# Patient Record
Sex: Female | Born: 1985 | Race: White | Hispanic: No | Marital: Single | Smoking: Former smoker
Health system: Southern US, Community
[De-identification: ages and names within clinical notes are randomized; demographics above are authoritative.]

## PROBLEM LIST (undated history)

## (undated) DIAGNOSIS — J40 Bronchitis, not specified as acute or chronic: Secondary | ICD-10-CM

## (undated) DIAGNOSIS — Z8669 Personal history of other diseases of the nervous system and sense organs: Secondary | ICD-10-CM

## (undated) DIAGNOSIS — H6990 Unspecified Eustachian tube disorder, unspecified ear: Secondary | ICD-10-CM

## (undated) DIAGNOSIS — H6980 Other specified disorders of Eustachian tube, unspecified ear: Secondary | ICD-10-CM

## (undated) HISTORY — PX: OTHER SURGICAL HISTORY: SHX169

---
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
## 2006-05-30 ENCOUNTER — Emergency Department: Payer: Self-pay | Admitting: Emergency Medicine

## 2007-01-12 ENCOUNTER — Emergency Department: Payer: Self-pay | Admitting: Internal Medicine

## 2007-03-22 ENCOUNTER — Emergency Department: Payer: Self-pay | Admitting: Emergency Medicine

## 2008-10-24 ENCOUNTER — Emergency Department: Payer: Self-pay | Admitting: Emergency Medicine

## 2009-02-21 ENCOUNTER — Emergency Department: Payer: Self-pay | Admitting: Emergency Medicine

## 2011-04-09 ENCOUNTER — Emergency Department: Payer: Self-pay | Admitting: Unknown Physician Specialty

## 2011-06-04 ENCOUNTER — Emergency Department: Payer: Self-pay | Admitting: Emergency Medicine

## 2011-08-04 ENCOUNTER — Observation Stay: Payer: Self-pay

## 2011-08-04 LAB — URINALYSIS, COMPLETE
Bilirubin,UR: NEGATIVE
Blood: NEGATIVE
Glucose,UR: NEGATIVE mg/dL (ref 0–75)
Ketone: NEGATIVE
Leukocyte Esterase: NEGATIVE
Nitrite: NEGATIVE
Ph: 9 (ref 4.5–8.0)
RBC,UR: <1 /HPF (ref 0–5)
Specific Gravity: 1.019 (ref 1.003–1.030)

## 2011-09-18 ENCOUNTER — Inpatient Hospital Stay: Payer: Self-pay

## 2011-09-18 LAB — CBC WITH DIFFERENTIAL/PLATELET
Basophil #: 0 10*3/uL (ref 0.0–0.1)
Basophil %: 0.2 %
Eosinophil #: 0.1 10*3/uL (ref 0.0–0.7)
MCH: 30.4 pg (ref 26.0–34.0)
Monocyte #: 0.5 x10 3/mm (ref 0.2–0.9)
RBC: 4.05 10*6/uL (ref 3.80–5.20)
RDW: 12.7 % (ref 11.5–14.5)
WBC: 12.4 10*3/uL — ABNORMAL HIGH (ref 3.6–11.0)

## 2012-01-12 ENCOUNTER — Emergency Department: Payer: Self-pay | Admitting: Emergency Medicine

## 2012-01-14 LAB — BETA STREP CULTURE(ARMC)

## 2012-07-26 ENCOUNTER — Emergency Department: Payer: Self-pay | Admitting: Emergency Medicine

## 2012-09-23 ENCOUNTER — Emergency Department: Payer: Self-pay | Admitting: Emergency Medicine

## 2013-07-05 ENCOUNTER — Emergency Department: Payer: Self-pay | Admitting: Emergency Medicine

## 2013-10-12 ENCOUNTER — Emergency Department: Payer: Self-pay | Admitting: Emergency Medicine

## 2013-10-30 ENCOUNTER — Emergency Department: Payer: Self-pay | Admitting: Emergency Medicine

## 2013-10-30 LAB — CBC
HCT: 38.5 % (ref 35.0–47.0)
HGB: 13.2 g/dL (ref 12.0–16.0)
MCH: 31.3 pg (ref 26.0–34.0)
MCHC: 34.3 g/dL (ref 32.0–36.0)
MCV: 91 fL (ref 80–100)
Platelet: 291 10*3/uL (ref 150–440)
RBC: 4.22 10*6/uL (ref 3.80–5.20)
RDW: 12.7 % (ref 11.5–14.5)
WBC: 7 10*3/uL (ref 3.6–11.0)

## 2013-10-30 LAB — COMPREHENSIVE METABOLIC PANEL
Albumin: 3.6 g/dL (ref 3.4–5.0)
Alkaline Phosphatase: 125 U/L — ABNORMAL HIGH
Anion Gap: 8 (ref 7–16)
BILIRUBIN TOTAL: 0.3 mg/dL (ref 0.2–1.0)
BUN: 8 mg/dL (ref 7–18)
CALCIUM: 8.2 mg/dL — AB (ref 8.5–10.1)
CO2: 23 mmol/L (ref 21–32)
Chloride: 109 mmol/L — ABNORMAL HIGH (ref 98–107)
Creatinine: 0.65 mg/dL (ref 0.60–1.30)
EGFR (Non-African Amer.): >60
Glucose: 81 mg/dL (ref 65–99)
Osmolality: 277 (ref 275–301)
Potassium: 3.2 mmol/L — ABNORMAL LOW (ref 3.5–5.1)
SGOT(AST): 23 U/L (ref 15–37)
SGPT (ALT): 35 U/L
Sodium: 140 mmol/L (ref 136–145)
Total Protein: 6.6 g/dL (ref 6.4–8.2)

## 2013-11-16 ENCOUNTER — Emergency Department: Payer: Self-pay | Admitting: Emergency Medicine

## 2013-12-17 ENCOUNTER — Emergency Department: Payer: Self-pay | Admitting: Emergency Medicine

## 2014-01-04 ENCOUNTER — Emergency Department: Payer: Self-pay | Admitting: Emergency Medicine

## 2014-01-12 ENCOUNTER — Emergency Department: Payer: Self-pay | Admitting: Emergency Medicine

## 2014-01-15 ENCOUNTER — Emergency Department: Payer: Self-pay | Admitting: Student

## 2014-01-15 LAB — BASIC METABOLIC PANEL
Anion Gap: 7 (ref 7–16)
BUN: 5 mg/dL — ABNORMAL LOW (ref 7–18)
CALCIUM: 9.1 mg/dL (ref 8.5–10.1)
Chloride: 108 mmol/L — ABNORMAL HIGH (ref 98–107)
Co2: 26 mmol/L (ref 21–32)
Creatinine: 0.72 mg/dL (ref 0.60–1.30)
EGFR (African American): >60
GLUCOSE: 84 mg/dL (ref 65–99)
Osmolality: 278 (ref 275–301)
Potassium: 3.6 mmol/L (ref 3.5–5.1)
Sodium: 141 mmol/L (ref 136–145)

## 2014-01-15 LAB — CBC
HCT: 42.3 % (ref 35.0–47.0)
HGB: 14.3 g/dL (ref 12.0–16.0)
MCH: 30.7 pg (ref 26.0–34.0)
MCHC: 33.8 g/dL (ref 32.0–36.0)
MCV: 91 fL (ref 80–100)
PLATELETS: 336 10*3/uL (ref 150–440)
RBC: 4.66 10*6/uL (ref 3.80–5.20)
RDW: 12.5 % (ref 11.5–14.5)
WBC: 8.7 10*3/uL (ref 3.6–11.0)

## 2014-02-01 ENCOUNTER — Emergency Department: Payer: Self-pay | Admitting: Student

## 2014-05-17 ENCOUNTER — Emergency Department: Payer: Self-pay | Admitting: Emergency Medicine

## 2014-05-17 LAB — CBC
HCT: 43.4 % (ref 35.0–47.0)
HGB: 14.3 g/dL (ref 12.0–16.0)
MCH: 30.3 pg (ref 26.0–34.0)
MCHC: 33.1 g/dL (ref 32.0–36.0)
MCV: 92 fL (ref 80–100)
Platelet: 282 10*3/uL (ref 150–440)
RBC: 4.73 10*6/uL (ref 3.80–5.20)
RDW: 12.6 % (ref 11.5–14.5)
WBC: 5.9 10*3/uL (ref 3.6–11.0)

## 2014-05-17 LAB — COMPREHENSIVE METABOLIC PANEL
ALBUMIN: 4.1 g/dL (ref 3.4–5.0)
AST: 30 U/L (ref 15–37)
Alkaline Phosphatase: 113 U/L (ref 46–116)
Anion Gap: 6 — ABNORMAL LOW (ref 7–16)
BUN: 17 mg/dL (ref 7–18)
Bilirubin,Total: 0.2 mg/dL (ref 0.2–1.0)
CO2: 29 mmol/L (ref 21–32)
Calcium, Total: 9 mg/dL (ref 8.5–10.1)
Chloride: 105 mmol/L (ref 98–107)
Creatinine: 0.73 mg/dL (ref 0.60–1.30)
EGFR (African American): >60
EGFR (Non-African Amer.): >60
GLUCOSE: 79 mg/dL (ref 65–99)
OSMOLALITY: 280 (ref 275–301)
Potassium: 3.6 mmol/L (ref 3.5–5.1)
SGPT (ALT): 34 U/L (ref 14–63)
SODIUM: 140 mmol/L (ref 136–145)
Total Protein: 7.6 g/dL (ref 6.4–8.2)

## 2014-05-17 LAB — URINALYSIS, COMPLETE
BILIRUBIN, UR: NEGATIVE
GLUCOSE, UR: NEGATIVE mg/dL (ref 0–75)
NITRITE: NEGATIVE
Ph: 5 (ref 4.5–8.0)
Protein: NEGATIVE
Specific Gravity: 1.02 (ref 1.003–1.030)
Squamous Epithelial: 10
WBC UR: 188 /HPF (ref 0–5)

## 2014-08-02 ENCOUNTER — Emergency Department
Admit: 2014-08-02 | Disposition: A | Discharge status: Home / Self Care | Payer: Self-pay | Admitting: Emergency Medicine

## 2014-08-17 NOTE — H&P (Signed)
L&D Evaluation:  History:   HPI 29 year old G2P1001 presents to L&D at 31 weeks after 3 episodes of vomiting this AM followed by intense abdominal pain/cramping. ED[facility]05/11[facility], WNL anatomy scan. Unremarkable PNC so far. Ob hx of 1 SVD in 2007 in which she claims to have had "preterm labor" which was stopped at 32 weeks and subsequently delivered at term.  Labs: O Pos, VI, RI    Presents with abdominal pain, nausea/vomiting    Patient's Medical History No Chronic Illness    Patient's Surgical History none    Medications Pre Natal Vitamins    Allergies keflex    Social History none    Family History Non-Contributory   ROS:   ROS see HPI   Exam:   Vital Signs stable    Urine Protein trace    General no apparent distress, breathing through some ctx's    Mental Status clear    Chest clear    Abdomen gravid, non-tender    Estimated Fetal Weight Average for gestational age    Back no CVAT    Edema no edema    Pelvic no external lesions, closed/ft dilated per RN    Mebranes Intact    FHT normal rate with no decels    Ucx much irritability noted, occas ctx    Skin dry   Impression:   Impression dehydration, possible gastroenteritis, uterine irritabillity   Plan:   Plan UA, EFM/NST, monitor contractions and for cervical change, fluids, IV hydration, terbultiline x1, zofran IV, monitor    Comments FFN has been sent, UA negative   Electronic Signatures: Shella MaximPutnam, Edison Wollschlager (CNM)  (Signed 27-Apr-13 11:12)  Authored: L&D Evaluation   Last Updated: 27-Apr-13 11:12 by Shella MaximPutnam, Lakisa Lotz (CNM)

## 2014-08-17 NOTE — H&P (Signed)
L&D Evaluation:  History Expanded:   HPI 29 year old G2P1001, EDD of 10/05/11 per early US, presents to L&D at 37 4/7 weeks with c/o contractions and severe back muscle spasms. Given Flexeril recently with not relief. No LOF or VB.  [facility] and frequent URI's.  Ob hx of 1 SVD in 2007 in which she claims to have had "preterm labor" which was stopped at 32 weeks and subsequently delivered at term.    Blood Type O positive    Group B Strep Results (Result >5wks must be treated as unknown) negative    Maternal HIV Negative    Maternal Syphilis Ab Nonreactive    Maternal Varicella Immune    Rubella Results immune    Maternal T-Dap Unknown    Presents with abdominal pain, nausea/vomiting    Patient's Medical History No Chronic Illness    Patient's Surgical History knee surgery    Medications Pre Natal Vitamins    Allergies keflex    Social History tobacco    Family History Non-Contributory   ROS:   ROS see HPI   Exam:   Vital Signs stable    Urine Protein trace    General no apparent distress    Mental Status clear    Abdomen gravid, non-tender    Estimated Fetal Weight Average for gestational age    Back no CVAT    Edema no edema    Pelvic no external lesions, 4/75/-1    Mebranes Intact    FHT normal rate with no decels    Ucx regular    Skin dry   Impression:   Impression early labor   Plan:   Plan UA, EFM/NST, monitor contractions and for cervical change, antibiotics for GBBS prophylaxis    Comments IV Stadol   Electronic Signatures: Vella KohlerBrothers, Maday Guarino K (CNM)  (Signed 11-Jun-13 07:04)  Authored: L&D Evaluation   Last Updated: 11-Jun-13 07:04 by Vella KohlerBrothers, Gavon Majano K (CNM)

## 2014-12-11 ENCOUNTER — Emergency Department
Admission: EM | Admit: 2014-12-11 | Discharge: 2014-12-11 | Disposition: A | Discharge status: Home / Self Care | Payer: Self-pay | Attending: Emergency Medicine | Admitting: Emergency Medicine

## 2014-12-11 ENCOUNTER — Encounter: Payer: Self-pay | Admitting: Emergency Medicine

## 2014-12-11 DIAGNOSIS — H6691 Otitis media, unspecified, right ear: Secondary | ICD-10-CM | POA: Insufficient documentation

## 2014-12-11 DIAGNOSIS — Z720 Tobacco use: Secondary | ICD-10-CM | POA: Insufficient documentation

## 2014-12-11 HISTORY — DX: Unspecified eustachian tube disorder, unspecified ear: H69.90

## 2014-12-11 HISTORY — DX: Bronchitis, not specified as acute or chronic: J40

## 2014-12-11 HISTORY — DX: Other specified disorders of Eustachian tube, unspecified ear: H69.80

## 2014-12-11 MED ORDER — AMOXICILLIN-POT CLAVULANATE 875-125 MG PO TABS: 1.0000 | ORAL_TABLET | Freq: Two times a day (BID) | ORAL | Status: DC

## 2014-12-11 NOTE — ED Notes (Signed)
Pt reports sinus congestion and chest congestion; bilateral ear pain.

## 2014-12-11 NOTE — ED Notes (Signed)
Pt states nasal congestion and bilateral ear congestion for several weeks, worsening past several days.  Pt seen by PCP on prednisone, zyrtec, and IBU.  Pt states MD dx with eustachian tube dysfunction.

## 2014-12-11 NOTE — ED Provider Notes (Signed)
[facility] Emergency Department Provider Note ____________________________________________  Time seen: Approximately 8:20 PM  I have reviewed the triage vital signs and the nursing notes.   HISTORY  Chief Complaint Nasal Congestion   HPI Peggy King is a 29 y.o. female patient is here with nasal congestion and bilateral ear condition for several weeks. She states this been worsening for the last 2 days. Patient states she was seen by her PCP and placed on prednisone, Zyrtec and ibuprofen. She states that this is not helping. She is unaware of any fever or chills. She continues to smoke and is trying to stop. Currently her pain is 5 out of 10.   Past Medical History  Diagnosis Date  . Bronchitis   . Eustachian tube dysfunction     There are no active problems to display for this patient.   History reviewed. No pertinent past surgical history.  Current Outpatient Rx  Name  Route  Sig  Dispense  Refill  . amoxicillin-clavulanate (AUGMENTIN) 875-125 MG per tablet   Oral   Take 1 tablet by mouth 2 (two) times daily.   20 tablet   0     Allergies Prednisone  No family history on file.  Social History Social History  Substance Use Topics  . Smoking status: Current Every Day Smoker  . Smokeless tobacco: None  . Alcohol Use: No    Review of Systems Constitutional: No fever/chills Eyes: No visual changes. ENT: No sore throat. Bilateral ear pain Cardiovascular: Denies chest pain. Respiratory: Denies shortness of breath. Gastrointestinal: No abdominal pain.  No nausea, no vomiting.   Genitourinary: Negative for dysuria. Musculoskeletal: Negative for back pain. Skin: Negative for rash. Neurological: Negative for headaches, focal weakness or numbness.  10-point ROS otherwise negative.  ____________________________________________   PHYSICAL EXAM:  VITAL SIGNS: ED Triage Vitals  Enc Vitals Group     BP 12/11/14 1929 107/65 mmHg    Pulse Rate 12/11/14 1929 99     Resp 12/11/14 1929 16     Temp 12/11/14 1929 98.1 F (36.7 C)     Temp Source 12/11/14 1929 Oral     SpO2 12/11/14 1929 97 %     Weight 12/11/14 1929 115 lb (52.164 kg)     Height 12/11/14 1929 5\' 2"  (1.575 m)     Head Cir --      Peak Flow --      Pain Score 12/11/14 1930 5     Pain Loc --      Pain Edu? --      Excl. in GC? --     Constitutional: Alert and oriented. Well appearing and in no acute distress. Eyes: Conjunctivae are normal. PERRL. EOMI. Head: Atraumatic. Nose: Mild congestion/rhinnorhea.  Right TM is red and dull, left TM dull Mouth/Throat: Mucous membranes are moist.  Oropharynx non-erythematous. Neck: No stridor.   Cardiovascular: Normal rate, regular rhythm. Grossly normal heart sounds.  Good peripheral circulation. Respiratory: Normal respiratory effort.  No retractions. Lungs CTAB. Gastrointestinal: Soft and nontender. No distention. No abdominal bruits. No CVA tenderness. Musculoskeletal: No lower extremity tenderness nor edema.  No joint effusions. Neurologic:  Normal speech and language. No gross focal neurologic deficits are appreciated. No gait instability. Skin:  Skin is warm, dry and intact. No rash noted. Psychiatric: Mood and affect are normal. Speech and behavior are normal.  ____________________________________________   LABS (all labs ordered are listed, but only abnormal results are displayed)  Labs Reviewed - No data to  display   PROCEDURES  Procedure(s) performed: None  Critical Care performed: No  ____________________________________________   INITIAL IMPRESSION / ASSESSMENT AND PLAN / ED COURSE  Pertinent labs & imaging results that were available during my care of the patient were reviewed by me and considered in my medical decision making (see chart for details).  Patient was started on Augmentin twice a day for 10 days. She is containing medication that her PCP has restarted her on and follow  up if any problems. ____________________________________________   FINAL CLINICAL IMPRESSION(S) / ED DIAGNOSES  Final diagnoses:  Acute right otitis media, recurrence not specified, unspecified otitis media type      Tommi Rumps, PA-C 12/12/14 1610  Arnaldo Natal, MD 12/12/14 2333

## 2015-03-08 IMAGING — CR DG CHEST 2V
1 series · 2 of 2 positions shown · non-contrast
Comparison: 10/12/2013.

CLINICAL DATA: Shortness of breath.

EXAM:
CHEST  2 VIEW

[Series 1: w chest pa · 0.14mm/px · 2 of 2 slices shown]
[im 1/2]
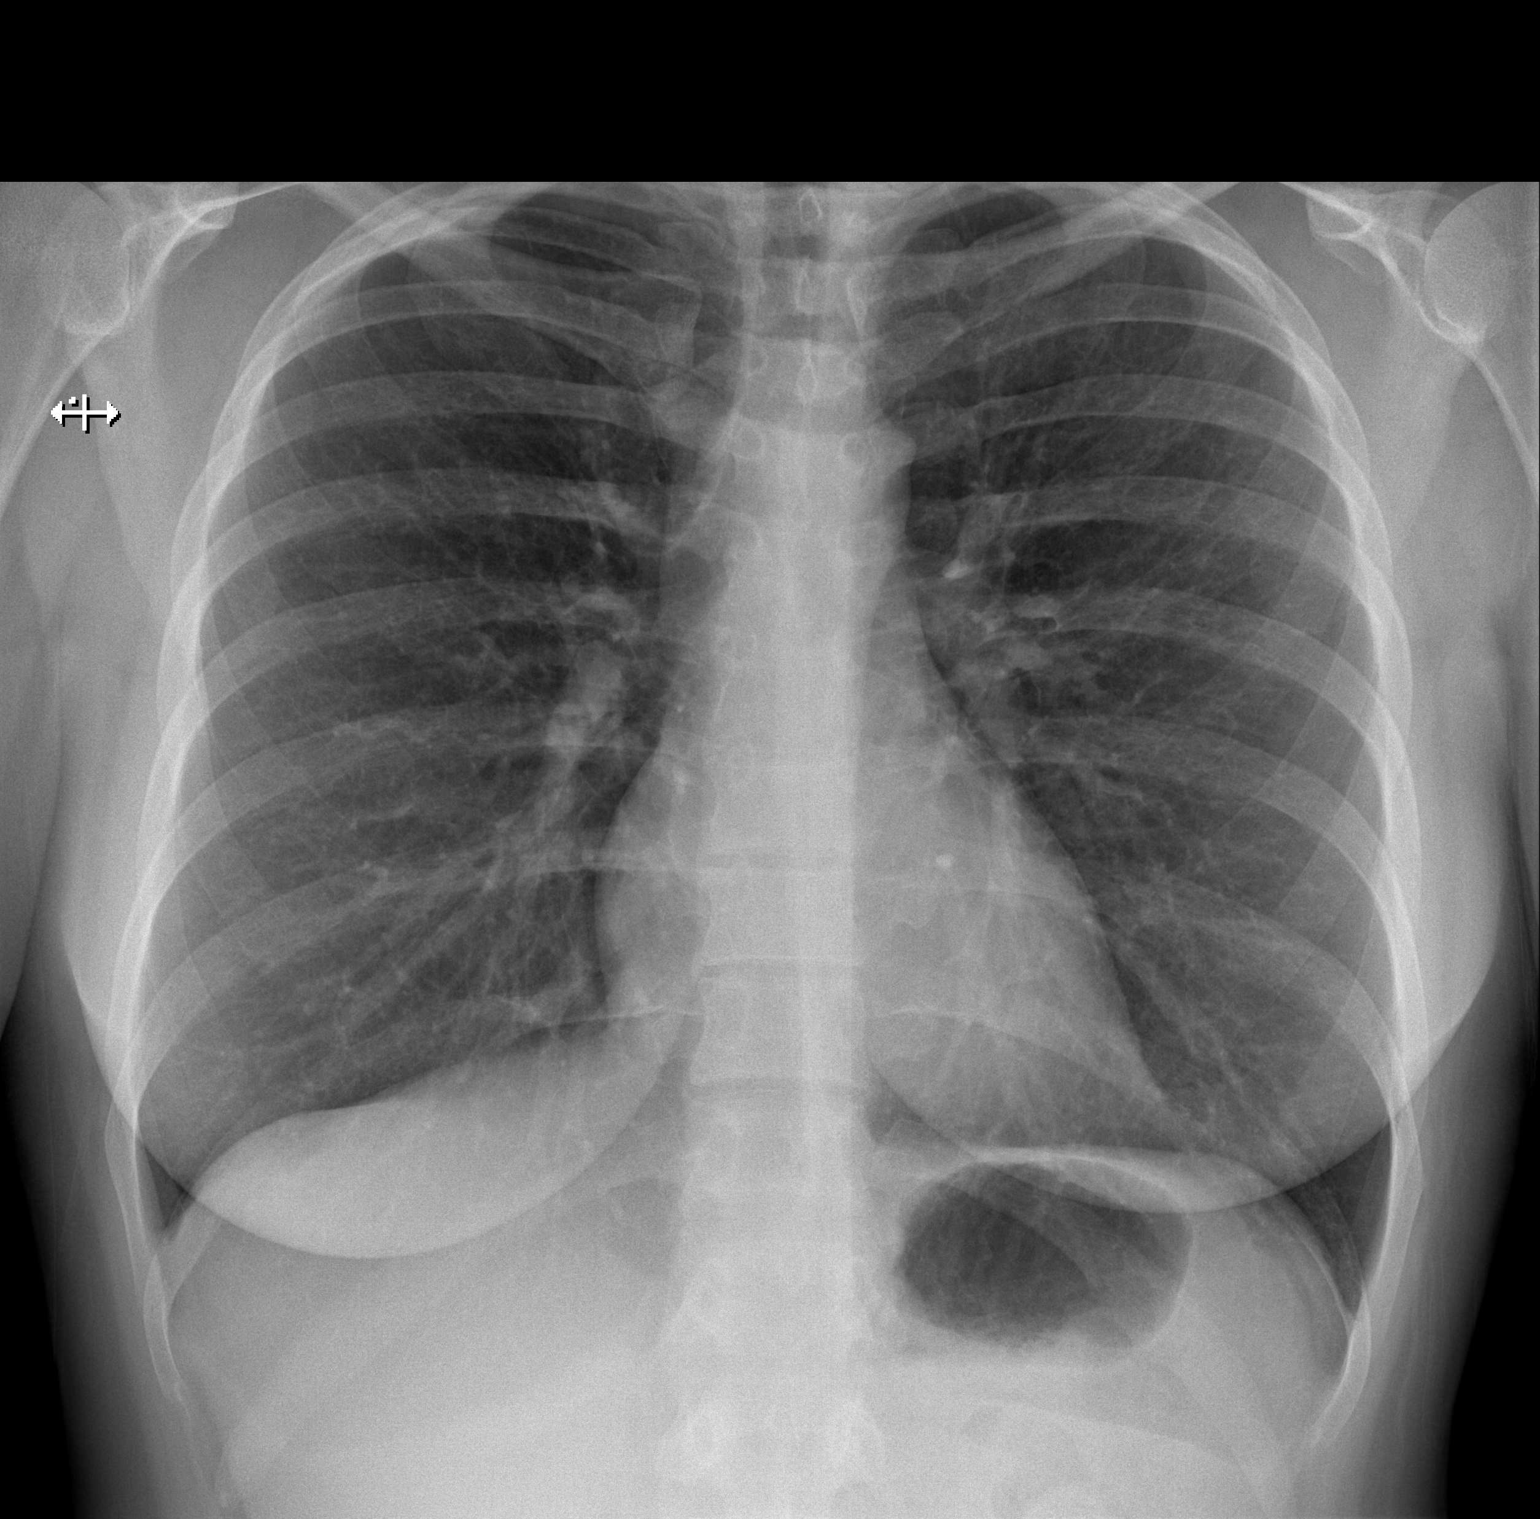
[im 2/2]
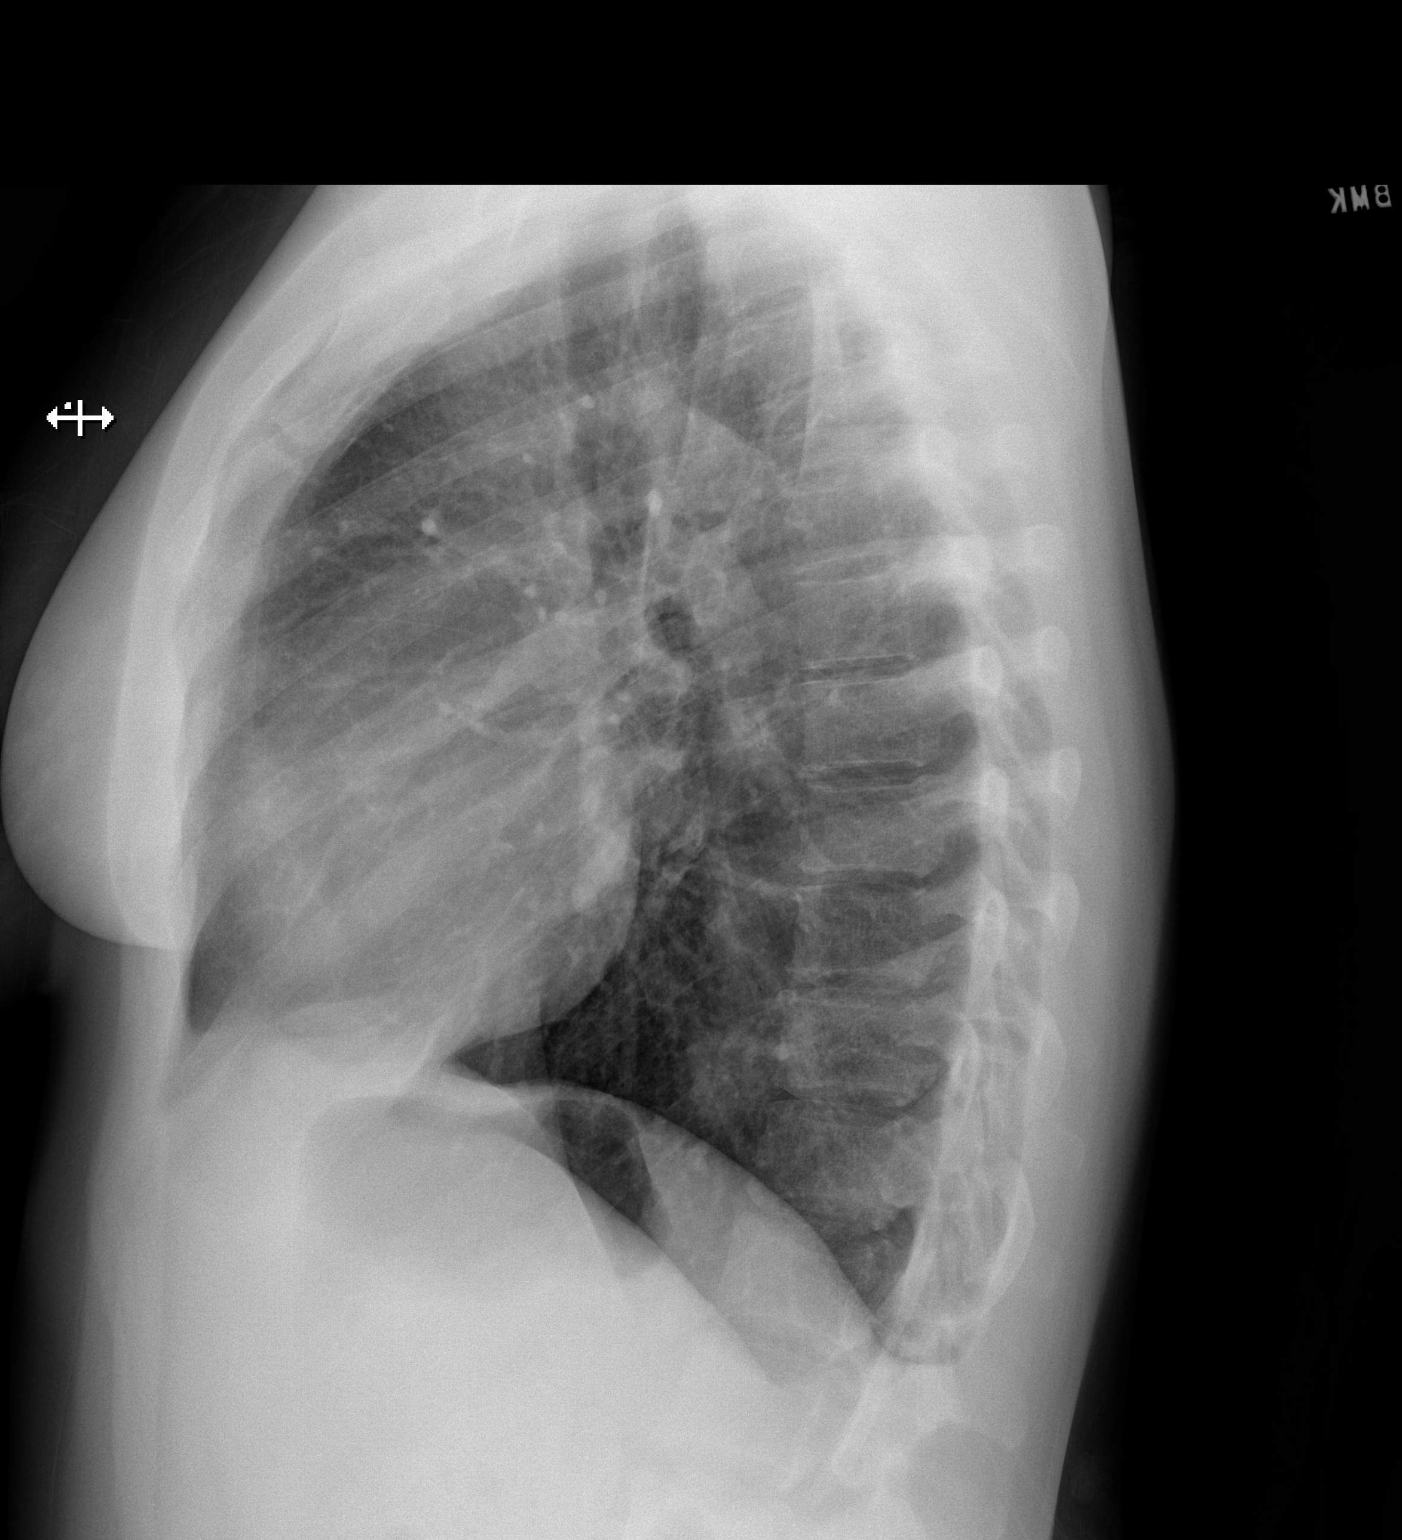

[2 of 2 positions shown; findings below may reference images not displayed]

FINDINGS: Mediastinum and hilar structures normal. Lungs are clear. Heart size
normal. No pleural effusion or pneumothorax. No acute bony
abnormality.
IMPRESSION: No acute abnormality.

## 2015-07-14 DIAGNOSIS — R5383 Other fatigue: Secondary | ICD-10-CM | POA: Insufficient documentation

## 2015-07-14 DIAGNOSIS — Z792 Long term (current) use of antibiotics: Secondary | ICD-10-CM | POA: Insufficient documentation

## 2015-07-14 DIAGNOSIS — F17200 Nicotine dependence, unspecified, uncomplicated: Secondary | ICD-10-CM | POA: Insufficient documentation

## 2015-07-14 DIAGNOSIS — K047 Periapical abscess without sinus: Secondary | ICD-10-CM | POA: Insufficient documentation

## 2015-07-15 ENCOUNTER — Emergency Department
Admission: EM | Admit: 2015-07-15 | Discharge: 2015-07-15 | Disposition: A | Discharge status: Home / Self Care | Payer: Self-pay | Attending: Emergency Medicine | Admitting: Emergency Medicine

## 2015-07-15 DIAGNOSIS — R52 Pain, unspecified: Secondary | ICD-10-CM

## 2015-07-15 DIAGNOSIS — K047 Periapical abscess without sinus: Secondary | ICD-10-CM

## 2015-07-15 DIAGNOSIS — R6883 Chills (without fever): Secondary | ICD-10-CM

## 2015-07-15 MED ORDER — ACETAMINOPHEN 500 MG PO TABS
ORAL_TABLET | ORAL | Status: AC
  Administered 2015-07-15: 500 mg via ORAL
  Filled 2015-07-15: qty 1

## 2015-07-15 MED ORDER — ACETAMINOPHEN 500 MG PO TABS
1000.0000 mg | ORAL_TABLET | Freq: Once | ORAL | Status: AC
  Administered 2015-07-15: 500 mg via ORAL
  Filled 2015-07-15: qty 2

## 2015-07-15 MED ORDER — CEPHALEXIN 500 MG PO CAPS
500.0000 mg | ORAL_CAPSULE | Freq: Once | ORAL | Status: AC
  Administered 2015-07-15: 500 mg via ORAL
  Filled 2015-07-15: qty 1

## 2015-07-15 MED ORDER — CEPHALEXIN 500 MG PO CAPS: 500.0000 mg | ORAL_CAPSULE | Freq: Four times a day (QID) | ORAL | Status: AC

## 2015-07-15 MED ORDER — ACETAMINOPHEN 500 MG PO TABS
500.0000 mg | ORAL_TABLET | Freq: Once | ORAL | Status: AC
  Administered 2015-07-15: 500 mg via ORAL

## 2015-07-15 MED ORDER — ONDANSETRON 4 MG PO TBDP
4.0000 mg | ORAL_TABLET | Freq: Once | ORAL | Status: AC
  Administered 2015-07-15: 4 mg via ORAL
  Filled 2015-07-15: qty 1

## 2015-07-15 MED ORDER — TRAMADOL HCL 50 MG PO TABS: 50.0000 mg | ORAL_TABLET | Freq: Four times a day (QID) | ORAL | Status: DC | PRN

## 2015-07-15 NOTE — ED Notes (Signed)
Pt in with co chills and body aches since yest, also had tooth pulled yest.  Pt unsure if related to tooth extraction.

## 2015-07-15 NOTE — Discharge Instructions (Signed)
Musculoskeletal Pain Musculoskeletal pain is muscle and boney aches and pains. These pains can occur in any part of the body. Your caregiver may treat you without knowing the cause of the pain. They may treat you if blood or urine tests, X-rays, and other tests were normal.  CAUSES There is often not a definite cause or reason for these pains. These pains may be caused by a type of germ (virus). The discomfort may also come from overuse. Overuse includes working out too hard when your body is not fit. Boney aches also come from weather changes. Bone is sensitive to atmospheric pressure changes. HOME CARE INSTRUCTIONS   Ask when your test results will be ready. Make sure you get your test results.  Only take over-the-counter or prescription medicines for pain, discomfort, or fever as directed by your caregiver. If you were given medications for your condition, do not drive, operate machinery or power tools, or sign legal documents for 24 hours. Do not drink alcohol. Do not take sleeping pills or other medications that may interfere with treatment.  Continue all activities unless the activities cause more pain. When the pain lessens, slowly resume normal activities. Gradually increase the intensity and duration of the activities or exercise.  During periods of severe pain, bed rest may be helpful. Lay or sit in any position that is comfortable.  Putting ice on the injured area.  Put ice in a bag.  Place a towel between your skin and the bag.  Leave the ice on for 15 to 20 minutes, 3 to 4 times a day.  Follow up with your caregiver for continued problems and no reason can be found for the pain. If the pain becomes worse or does not go away, it may be necessary to repeat tests or do additional testing. Your caregiver may need to look further for a possible cause. SEEK IMMEDIATE MEDICAL CARE IF:  You have pain that is getting worse and is not relieved by medications.  You develop chest pain  that is associated with shortness or breath, sweating, feeling sick to your stomach (nauseous), or throw up (vomit).  Your pain becomes localized to the abdomen.  You develop any new symptoms that seem different or that concern you. MAKE SURE YOU:   Understand these instructions.  Will watch your condition.  Will get help right away if you are not doing well or get worse.   This information is not intended to replace advice given to you by your health care provider. Make sure you discuss any questions you have with your health care provider.   Document Released: 03/26/2005 Document Revised: 06/18/2011 Document Reviewed: 11/28/2012 Elsevier Interactive Patient Education 2016 Elsevier Inc[employer]  Dental Pain Dental pain may be caused by many things, including:  Tooth decay (cavities or caries). Cavities expose the nerve of your tooth to air and hot or cold temperatures. This can cause pain or discomfort.  Abscess or infection. A dental abscess is a collection of infected pus from a bacterial infection in the inner part of the tooth (pulp). It usually occurs at the end of the tooth's root.  Injury.  An unknown reason (idiopathic). Your pain may be mild or severe. It may only occur when:  You are chewing.  You are exposed to hot or cold temperature.  You are eating or drinking sugary foods or beverages, such as soda or candy. Your pain may also be constant. HOME CARE INSTRUCTIONS Watch your dental pain for any changes. The following  actions may help to lessen any discomfort that you are feeling:  Take medicines only as directed by your dentist.  If you were prescribed an antibiotic medicine, finish all of it even if you start to feel better.  Keep all follow-up visits as directed by your dentist. This is important.  Do not apply heat to the outside of your face.  Rinse your mouth or gargle with salt water if directed by your dentist. This helps with pain and swelling.  You can  make salt water by adding  tsp of salt to 1 cup of warm water.  Apply ice to the painful area of your face:  Put ice in a plastic bag.  Place a towel between your skin and the bag.  Leave the ice on for 20 minutes, 2-3 times per day.  Avoid foods or drinks that cause you pain, such as:  Very hot or very cold foods or drinks.  Sweet or sugary foods or drinks. SEEK MEDICAL CARE IF:  Your pain is not controlled with medicines.  Your symptoms are worse.  You have new symptoms. SEEK IMMEDIATE MEDICAL CARE IF:  You are unable to open your mouth.  You are having trouble breathing or swallowing.  You have a fever.  Your face, neck, or jaw is swollen.   This information is not intended to replace advice given to you by your health care provider. Make sure you discuss any questions you have with your health care provider.   Document Released: 03/26/2005 Document Revised: 08/10/2014 Document Reviewed: 03/22/2014 Elsevier Interactive Patient Education [employer] Elsevier Inc.

## 2015-07-15 NOTE — ED Notes (Signed)
Dr. Zenda AlpersWebster at bedside for pt evaluation. Pt tearful.

## 2015-07-15 NOTE — ED Provider Notes (Signed)
[facility] Emergency Department Provider Note  ____________________________________________  Time seen: Approximately 0047 AM  I have reviewed the triage vital signs and the nursing notes.   HISTORY  Chief Complaint Chills    HPI Peggy King is a 30 y.o. female who comes into the hospital today with chills. The patient had a tooth pulled yesterday and is unsure if that has anything to do with it. She reports that there was infection in her tooth but she was not given any antibiotics. She reports that her mouth hurts and she is having some body aches. She has been taking 800 mg ibuprofen all day. She reports that she is unable to measure her temperature but she feels as though she is been hot. The patient rates her pain 8 out of 10 in intensity reports that her overall pain. She denies any nausea or vomiting but has had some runny nose and mild cough. The patient reports that she didn't know what to do and she did not feel well so she decided to come in and get checked out.   Past Medical History  Diagnosis Date  . Bronchitis   . Eustachian tube dysfunction     There are no active problems to display for this patient.   No past surgical history on file.  Current Outpatient Rx  Name  Route  Sig  Dispense  Refill  . amoxicillin-clavulanate (AUGMENTIN) 875-125 MG per tablet   Oral   Take 1 tablet by mouth 2 (two) times daily.   20 tablet   0   . cephALEXin (KEFLEX) 500 MG capsule   Oral   Take 1 capsule (500 mg total) by mouth 4 (four) times daily.   20 capsule   0   . traMADol (ULTRAM) 50 MG tablet   Oral   Take 1 tablet (50 mg total) by mouth every 6 (six) hours as needed.   12 tablet   0     Allergies Prednisone  No family history on file.  Social History Social History  Substance Use Topics  . Smoking status: Current Every Day Smoker  . Smokeless tobacco: Not on file  . Alcohol Use: No    Review of Systems Constitutional:  chills Eyes: No visual changes. ENT: Dental pain Cardiovascular: Denies chest pain. Respiratory: Denies shortness of breath. Gastrointestinal: No abdominal pain.  No nausea, no vomiting.  No diarrhea.  No constipation. Genitourinary: Negative for dysuria. Musculoskeletal: Body aches Skin: Negative for rash. Neurological: Negative for headaches, focal weakness or numbness.  10-point ROS otherwise negative.  ____________________________________________   PHYSICAL EXAM:  VITAL SIGNS: ED Triage Vitals  Enc Vitals Group     BP 07/15/15 0002 113/62 mmHg     Pulse Rate 07/15/15 0002 79     Resp 07/15/15 0002 18     Temp 07/15/15 0002 98 F (36.7 C)     Temp Source 07/15/15 0002 Oral     SpO2 07/15/15 0002 99 %     Weight 07/15/15 0002 120 lb (54.432 kg)     Height 07/15/15 0002 5\' 2"  (1.575 m)     Head Cir --      Peak Flow --      Pain Score 07/15/15 0003 7     Pain Loc --      Pain Edu? --      Excl. in GC? --     Constitutional: Alert and oriented. Well appearing and in Moderate distress. Eyes: Conjunctivae are normal.  PERRL. EOMI. Head: Atraumatic. Nose: No congestion/rhinnorhea. Mouth/Throat: Mucous membranes are moist.  Some erythema and tenderness to gum is where tooth was pulled yesterday.  Cardiovascular: Normal rate, regular rhythm. Grossly normal heart sounds.  Good peripheral circulation. Respiratory: Normal respiratory effort.  No retractions. Lungs CTAB. Gastrointestinal: Soft and nontender. No distention. Positive bowel sounds Musculoskeletal: No lower extremity tenderness nor edema.   Neurologic:  Normal speech and language.  Skin:  Skin is warm, dry and intact.  Psychiatric: Mood and affect are normal.   ____________________________________________   LABS (all labs ordered are listed, but only abnormal results are displayed)  Labs Reviewed - No data to  display ____________________________________________  EKG  None ____________________________________________  RADIOLOGY  None ____________________________________________   PROCEDURES  Procedure(s) performed: None  Critical Care performed: No  ____________________________________________   INITIAL IMPRESSION / ASSESSMENT AND PLAN / ED COURSE  Pertinent labs & imaging results that were available during my care of the patient were reviewed by me and considered in my medical decision making (see chart for details).  This is a 30 year old female who comes into the hospital today with some chills and body aches after having her tooth pulled yesterday. The patient does have some erythema and rotation to the gums which is likely causing some of her symptoms. I will give the patient a gram of Tylenol as well as some keflex. I will reassess the patient after the medication.  Patient developed some nausea so she did receive some Zofran  The patient is currently sleeping without any difficulty. She'll be discharged home to follow-up with her primary care physician. ____________________________________________   FINAL CLINICAL IMPRESSION(S) / ED DIAGNOSES  Final diagnoses:  Chills  Dental infection  Body aches      Rebecka Apley, MD 07/15/15 [phone]

## 2016-10-24 ENCOUNTER — Emergency Department
Admission: EM | Admit: 2016-10-24 | Discharge: 2016-10-24 | Disposition: A | Discharge status: Home / Self Care | Payer: 59 | Attending: Emergency Medicine | Admitting: Emergency Medicine

## 2016-10-24 DIAGNOSIS — H65111 Acute and subacute allergic otitis media (mucoid) (sanguinous) (serous), right ear: Secondary | ICD-10-CM | POA: Diagnosis not present

## 2016-10-24 DIAGNOSIS — H9201 Otalgia, right ear: Secondary | ICD-10-CM | POA: Diagnosis present

## 2016-10-24 DIAGNOSIS — J329 Chronic sinusitis, unspecified: Secondary | ICD-10-CM | POA: Diagnosis not present

## 2016-10-24 DIAGNOSIS — B9689 Other specified bacterial agents as the cause of diseases classified elsewhere: Secondary | ICD-10-CM | POA: Diagnosis not present

## 2016-10-24 DIAGNOSIS — F17200 Nicotine dependence, unspecified, uncomplicated: Secondary | ICD-10-CM | POA: Insufficient documentation

## 2016-10-24 MED ORDER — AMOXICILLIN-POT CLAVULANATE 875-125 MG PO TABS: 1.0000 | ORAL_TABLET | Freq: Two times a day (BID) | ORAL | 0 refills | Status: AC

## 2016-10-24 NOTE — ED Provider Notes (Signed)
[facility] Emergency Department Provider Note  ____________________________________________  Time seen: Approximately 10:11 PM  I have reviewed the triage vital signs and the nursing notes.   HISTORY  Chief Complaint Otalgia    HPI Peggy King is a 31 y.o. female who presents to emergency department complaining of ongoing sinus infection and right ear pain. Patient reports that she was seen by a provider that was not typically her primary care provider and diagnosed with sinus infection and placed on Ceftin ear. Patient reports that she has long-standing history of recurrent sinus infections is typically refractory to normal dosing of antibiotics. She reports that she typically requires 2 weeks of Augmentin to clear sinus infection. Patient reports that she is not bleeding at this antibiotic was effective in managing her sinus infection and has increased to an infection. Patient does report a significant history of otitis media secondary to recurrent sinus infections. Patient denies any fevers or chills, difficulty breathing or swallowing, chest pain, shortness of breath, abdominal pain, nausea vomiting. Patient has been taking her antibiotic as prescribed. She takes Flonase and Claritin. Patient reports that she is unable to take prednisone as she typically has dystonic-like reactions.   Past Medical History:  Diagnosis Date  . Bronchitis   . Eustachian tube dysfunction     There are no active problems to display for this patient.   No past surgical history on file.  Prior to Admission medications   Medication Sig Start Date End Date Taking? Authorizing Provider  amoxicillin-clavulanate (AUGMENTIN) 875-125 MG tablet Take 1 tablet by mouth 2 (two) times daily. 10/24/16 11/03/16  Cuthriell, Delorise RoyalsJonathan D, PA-C  traMADol (ULTRAM) 50 MG tablet Take 1 tablet (50 mg total) by mouth every 6 (six) hours as needed. 07/15/15   Rebecka ApleyWebster, Allison P, MD     Allergies Prednisone  No family history on file.  Social History Social History  Substance Use Topics  . Smoking status: Current Every Day Smoker  . Smokeless tobacco: Not on file  . Alcohol use No     Review of Systems  Constitutional: No fever/chills Eyes: No visual changes. No discharge ENT: Positive for nasal congestion, sinus pressure, right ear pain Cardiovascular: no chest pain. Respiratory: no cough. No SOB. Gastrointestinal: No abdominal pain.  No nausea, no vomiting.   Musculoskeletal: Negative for musculoskeletal pain. Skin: Negative for rash, abrasions, lacerations, ecchymosis. Neurological: Negative for headaches, focal weakness or numbness. 10-point ROS otherwise negative.  ____________________________________________   PHYSICAL EXAM:  VITAL SIGNS: ED Triage Vitals [10/24/16 2057]  Enc Vitals Group     BP 116/79     Pulse Rate 82     Resp 20     Temp 98.5 F (36.9 C)     Temp Source Oral     SpO2 98 %     Weight 120 lb (54.4 kg)     Height 5\' 2"  (1.575 m)     Head Circumference      Peak Flow      Pain Score 4     Pain Loc      Pain Edu?      Excl. in GC?      Constitutional: Alert and oriented. Well appearing and in no acute distress. Eyes: Conjunctivae are normal. PERRL. EOMI. Head: Atraumatic. ENT:      Ears: He seemed to left unremarkable. EAC on right is unremarkable. He TM on right is mildly dusky, bulging, air-fluid level.      Nose: No congestion/rhinnorhea.  She is moderate tender percussion over maxillary sinuses.      Mouth/Throat: Mucous membranes are moist. Pharynx is not erythematous and not edematous. Uvula is midline. Neck: No stridor. Neck is supple with full range of motion Hematological/Lymphatic/Immunilogical: No cervical lymphadenopathy. Cardiovascular: Normal rate, regular rhythm. Normal S1 and S2.  Good peripheral circulation. Respiratory: Normal respiratory effort without tachypnea or retractions. Lungs CTAB.  Good air entry to the bases with no decreased or absent breath sounds. Musculoskeletal: Full range of motion to all extremities. No gross deformities appreciated. Neurologic:  Normal speech and language. No gross focal neurologic deficits are appreciated.  Skin:  Skin is warm, dry and intact. No rash noted. Psychiatric: Mood and affect are normal. Speech and behavior are normal. Patient exhibits appropriate insight and judgement.   ____________________________________________   LABS (all labs ordered are listed, but only abnormal results are displayed)  Labs Reviewed - No data to display ____________________________________________  EKG   ____________________________________________  RADIOLOGY   No results found.  ____________________________________________    PROCEDURES  Procedure(s) performed:    Procedures    Medications - No data to display   ____________________________________________   INITIAL IMPRESSION / ASSESSMENT AND PLAN / ED COURSE  Pertinent labs & imaging results that were available during my care of the patient were reviewed by me and considered in my medical decision making (see chart for details).  Review of the [place] CSRS was performed in accordance of the [facility] prior to dispensing any controlled drugs.     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 Patient has been on Ceftin ear for the past 6 days. She reports that she has had little improvement of her sinus infection from this medication. Patient has a long-standing history of recurrent sinus infections which typically leads to otitis media. Patient reports that she is typically on 2 weeks worth of Augmentin for clearance. At this time, patient does appear to have emerging otitis media in addition to bacterial sinusitis. As such, patient is instructed to stop Ceftin ear and I will place patient on Augmentin for 10 days. She will follow up  with primary care or ENT provider for further management as necessary. Patient reports that she has adverse reactions to prednisone and a such I will not administer prednisone injection or discharge patient with prednisone. She is encouraged to continue using Claritin and Flonase at home. Tylenol and Motrin for pain..  Patient is given ED precautions to return to the ED for any worsening or new symptoms.     ____________________________________________  FINAL CLINICAL IMPRESSION(S) / ED DIAGNOSES  Final diagnoses:  Bacterial sinusitis  Acute mucoid otitis media of right ear      NEW MEDICATIONS STARTED DURING THIS VISIT:  Discharge Medication List as of 10/24/2016 10:12 PM          This chart was dictated using voice recognition software/Dragon. Despite best efforts to proofread, errors can occur which can change the meaning. Any change was purely unintentional.    Racheal Patches, PA-C 10/24/16 2339    Merrily Brittle, MD 10/24/16 [phone]

## 2016-10-24 NOTE — ED Notes (Signed)
Pt reports she has been on atb for the last week for a sinus infection/bronchitis - today she reports that her right ear started hurting - c/o nausea and headache

## 2016-10-24 NOTE — ED Triage Notes (Signed)
Pt in with co right earache that started today. States is currently on ceftinir for bronchitis/sinus infection for the past 7 days.

## 2017-08-23 ENCOUNTER — Ambulatory Visit (INDEPENDENT_AMBULATORY_CARE_PROVIDER_SITE_OTHER): Payer: 59 | Admitting: Psychology

## 2017-08-23 DIAGNOSIS — F4323 Adjustment disorder with mixed anxiety and depressed mood: Secondary | ICD-10-CM | POA: Diagnosis not present

## 2017-08-30 ENCOUNTER — Ambulatory Visit: Payer: 59 | Admitting: Psychology

## 2017-08-30 DIAGNOSIS — F4323 Adjustment disorder with mixed anxiety and depressed mood: Secondary | ICD-10-CM

## 2017-09-06 ENCOUNTER — Ambulatory Visit: Payer: 59 | Admitting: Psychology

## 2017-09-06 DIAGNOSIS — F4323 Adjustment disorder with mixed anxiety and depressed mood: Secondary | ICD-10-CM

## 2017-09-13 ENCOUNTER — Ambulatory Visit: Payer: 59 | Admitting: Psychology

## 2017-09-13 DIAGNOSIS — F4323 Adjustment disorder with mixed anxiety and depressed mood: Secondary | ICD-10-CM

## 2017-09-20 ENCOUNTER — Ambulatory Visit (INDEPENDENT_AMBULATORY_CARE_PROVIDER_SITE_OTHER): Payer: 59 | Admitting: Psychology

## 2017-09-20 DIAGNOSIS — F4323 Adjustment disorder with mixed anxiety and depressed mood: Secondary | ICD-10-CM | POA: Diagnosis not present

## 2017-09-27 ENCOUNTER — Ambulatory Visit: Payer: Self-pay | Admitting: Psychology

## 2017-10-29 ENCOUNTER — Encounter: Payer: Self-pay | Admitting: Emergency Medicine

## 2017-10-29 ENCOUNTER — Other Ambulatory Visit: Payer: Self-pay

## 2017-10-29 ENCOUNTER — Emergency Department
Admission: EM | Admit: 2017-10-29 | Discharge: 2017-10-29 | Disposition: A | Discharge status: Home / Self Care | Payer: Self-pay | Attending: Emergency Medicine | Admitting: Emergency Medicine

## 2017-10-29 DIAGNOSIS — R0982 Postnasal drip: Secondary | ICD-10-CM | POA: Insufficient documentation

## 2017-10-29 DIAGNOSIS — H65195 Other acute nonsuppurative otitis media, recurrent, left ear: Secondary | ICD-10-CM | POA: Insufficient documentation

## 2017-10-29 DIAGNOSIS — F17200 Nicotine dependence, unspecified, uncomplicated: Secondary | ICD-10-CM | POA: Insufficient documentation

## 2017-10-29 HISTORY — DX: Personal history of other diseases of the nervous system and sense organs: Z86.69

## 2017-10-29 MED ORDER — AZITHROMYCIN 250 MG PO TABS: ORAL_TABLET | ORAL | 0 refills | Status: DC

## 2017-10-29 MED ORDER — LORATADINE 10 MG PO TABS: 10.0000 mg | ORAL_TABLET | Freq: Every day | ORAL | 0 refills | Status: DC

## 2017-10-29 NOTE — ED Provider Notes (Signed)
[facility] Emergency Department Provider Note  ____________________________________________  Time seen: Approximately 8:39 PM  I have reviewed the triage vital signs and the nursing notes.   HISTORY  Chief Complaint Otalgia    HPI Peggy King is a 32 y.o. female presents emergency department for evaluation of left ear pain for 1 day.  Patient states that she gets ear infections every year and frequently has eustachian tube dysfunction.  She is allergic to prednisone and does not want Solu-Medrol or prednisone. She is also allergic to penicillin.  She has had some nasal drainage. No fever, chills.  Past Medical History:  Diagnosis Date  . Eustachian tube dysfunction   . History of frequent ear infections     There are no active problems to display for this patient.   History reviewed. No pertinent surgical history.  Prior to Admission medications   Medication Sig Start Date End Date Taking? Authorizing Provider  azithromycin (ZITHROMAX Z-PAK) 250 MG tablet Take 2 tablets (500 mg) on  Day 1,  followed by 1 tablet (250 mg) once daily on Days 2 through 5. 10/29/17   Enid DerryWagner, Maryhelen Lindler, PA-C  loratadine (CLARITIN) 10 MG tablet Take 1 tablet (10 mg total) by mouth daily. 10/29/17 10/29/18  Enid DerryWagner, Alianah Lofton, PA-C    Allergies Prednisone  No family history on file.  Social History Social History   Tobacco Use  . Smoking status: Current Every Day Smoker  . Smokeless tobacco: Never Used  Substance Use Topics  . Alcohol use: Yes    Comment: rare  . Drug use: Not on file     Review of Systems  Constitutional: No fever/chills ENT: Negative for congestion and rhinorrhea. Respiratory: Positive for cough.  Musculoskeletal: Negative for musculoskeletal pain. Skin: Negative for rash, abrasions, lacerations, ecchymosis. Neurological: Negative for headaches.   ____________________________________________   PHYSICAL EXAM:  VITAL SIGNS: ED Triage Vitals   Enc Vitals Group     BP 10/29/17 1937 117/79     Pulse Rate 10/29/17 1937 91     Resp 10/29/17 1937 18     Temp 10/29/17 1937 98.3 F (36.8 C)     Temp Source 10/29/17 1937 Oral     SpO2 10/29/17 1937 100 %     Weight 10/29/17 1945 118 lb (53.5 kg)     Height 10/29/17 1945 5\' 2"  (1.575 m)     Head Circumference --      Peak Flow --      Pain Score 10/29/17 1945 5     Pain Loc --      Pain Edu? --      Excl. in GC? --      Constitutional: Alert and oriented. Well appearing and in no acute distress. Eyes: Conjunctivae are normal. PERRL. EOMI. No discharge. Head: Atraumatic. ENT:       Ears: Left tympanic membrane erythematous.  Right tympanic membrane pearly.      Nose: Mild congestion/rhinnorhea.      Mouth/Throat: Mucous membranes are moist. Oropharynx non-erythematous. Tonsils not enlarged. No exudates. Uvula midline. Neck: No stridor.   Hematological/Lymphatic/Immunilogical: No cervical lymphadenopathy. Cardiovascular: Normal rate, regular rhythm.  Good peripheral circulation. Respiratory: Normal respiratory effort without tachypnea or retractions. Lungs CTAB. Good air entry to the bases with no decreased or absent breath sounds. Musculoskeletal: Full range of motion to all extremities. No gross deformities appreciated. Neurologic:  Normal speech and language. No gross focal neurologic deficits are appreciated.  Skin:  Skin is warm, dry and intact. No  rash noted. Psychiatric: Mood and affect are normal. Speech and behavior are normal. Patient exhibits appropriate insight and judgement.   ____________________________________________   LABS (all labs ordered are listed, but only abnormal results are displayed)  Labs Reviewed - No data to display ____________________________________________  EKG   ____________________________________________  RADIOLOGY  No results found.  ____________________________________________    PROCEDURES  Procedure(s) performed:     Procedures    Medications - No data to display   ____________________________________________   INITIAL IMPRESSION / ASSESSMENT AND PLAN / ED COURSE  Pertinent labs & imaging results that were available during my care of the patient were reviewed by me and considered in my medical decision making (see chart for details).  Review of the [place] CSRS was performed in accordance of the [facility] prior to dispensing any controlled drugs.   Patient's diagnosis is consistent with otitis media. Vital signs and exam are reassuring. Patient feels comfortable going home. Patient will be discharged home with prescriptions for azithromycin and claritin. She is allergic to penicillins. Patient is to follow up with ENT as needed or otherwise directed. Patient is given ED precautions to return to the ED for any worsening or new symptoms.     ____________________________________________  FINAL CLINICAL IMPRESSION(S) / ED DIAGNOSES  Final diagnoses:  Other recurrent acute nonsuppurative otitis media of left ear      NEW MEDICATIONS STARTED DURING THIS VISIT:  ED Discharge Orders        Ordered    azithromycin (ZITHROMAX Z-PAK) 250 MG tablet     10/29/17 2115    loratadine (CLARITIN) 10 MG tablet  Daily     10/29/17 2115          This chart was dictated using voice recognition software/Dragon. Despite best efforts to proofread, errors can occur which can change the meaning. Any change was purely unintentional.    Enid Derry, PA-C 10/29/17 2335    Don Perking, [place], MD 10/30/17 [phone]

## 2017-10-29 NOTE — ED Triage Notes (Signed)
Patient to ER for c/o pain to left ear since approx noon today. Denies any known fevers.

## 2017-10-30 ENCOUNTER — Encounter: Payer: Self-pay | Admitting: Emergency Medicine

## 2018-04-07 ENCOUNTER — Ambulatory Visit: Payer: 59 | Admitting: Psychology

## 2018-05-03 ENCOUNTER — Encounter: Payer: Self-pay | Admitting: Emergency Medicine

## 2018-05-03 ENCOUNTER — Emergency Department
Admission: EM | Admit: 2018-05-03 | Discharge: 2018-05-03 | Disposition: A | Discharge status: Home / Self Care | Payer: 59 | Attending: Emergency Medicine | Admitting: Emergency Medicine

## 2018-05-03 ENCOUNTER — Emergency Department: Payer: 59

## 2018-05-03 DIAGNOSIS — Z79899 Other long term (current) drug therapy: Secondary | ICD-10-CM | POA: Diagnosis not present

## 2018-05-03 DIAGNOSIS — J101 Influenza due to other identified influenza virus with other respiratory manifestations: Secondary | ICD-10-CM | POA: Insufficient documentation

## 2018-05-03 DIAGNOSIS — R509 Fever, unspecified: Secondary | ICD-10-CM | POA: Diagnosis present

## 2018-05-03 DIAGNOSIS — F17200 Nicotine dependence, unspecified, uncomplicated: Secondary | ICD-10-CM | POA: Insufficient documentation

## 2018-05-03 LAB — CBC WITH DIFFERENTIAL/PLATELET
Abs Immature Granulocytes: 0.01 10*3/uL (ref 0.00–0.07)
Basophils Absolute: 0 10*3/uL (ref 0.0–0.1)
Basophils Relative: 0 %
Eosinophils Absolute: 0 10*3/uL (ref 0.0–0.5)
Eosinophils Relative: 0 %
HCT: 42 % (ref 36.0–46.0)
Hemoglobin: 14.6 g/dL (ref 12.0–15.0)
Immature Granulocytes: 0 %
Lymphocytes Relative: 24 %
Lymphs Abs: 0.9 10*3/uL (ref 0.7–4.0)
MCH: 31.3 pg (ref 26.0–34.0)
MCHC: 34.8 g/dL (ref 30.0–36.0)
MCV: 90.1 fL (ref 80.0–100.0)
Monocytes Absolute: 0.4 10*3/uL (ref 0.1–1.0)
Monocytes Relative: 12 %
Neutro Abs: 2.3 10*3/uL (ref 1.7–7.7)
Neutrophils Relative %: 64 %
Platelets: 171 10*3/uL (ref 150–400)
RBC: 4.66 MIL/uL (ref 3.87–5.11)
RDW: 11.7 % (ref 11.5–15.5)
WBC: 3.6 10*3/uL — ABNORMAL LOW (ref 4.0–10.5)
nRBC: 0 % (ref 0.0–0.2)

## 2018-05-03 LAB — BASIC METABOLIC PANEL
Anion gap: 9 (ref 5–15)
BUN: 10 mg/dL (ref 6–20)
CO2: 24 mmol/L (ref 22–32)
Calcium: 8.5 mg/dL — ABNORMAL LOW (ref 8.9–10.3)
Chloride: 103 mmol/L (ref 98–111)
Creatinine, Ser: 0.57 mg/dL (ref 0.44–1.00)
GFR calc Af Amer: >60 mL/min (ref >60)
GFR calc non Af Amer: >60 mL/min (ref >60)
Glucose, Bld: 92 mg/dL (ref 70–99)
Potassium: 3.3 mmol/L — ABNORMAL LOW (ref 3.5–5.1)
Sodium: 136 mmol/L (ref 135–145)

## 2018-05-03 LAB — INFLUENZA PANEL BY PCR (TYPE A & B)
Influenza A By PCR: NEGATIVE
Influenza B By PCR: POSITIVE — AB

## 2018-05-03 MED ORDER — AZITHROMYCIN 250 MG PO TABS: ORAL_TABLET | ORAL | 0 refills | Status: AC

## 2018-05-03 MED ORDER — ACETAMINOPHEN 500 MG PO TABS
1000.0000 mg | ORAL_TABLET | Freq: Once | ORAL | Status: AC
  Administered 2018-05-03: 1000 mg via ORAL
  Filled 2018-05-03: qty 2

## 2018-05-03 MED ORDER — ACETAMINOPHEN 325 MG PO TABS: 650.0000 mg | ORAL_TABLET | Freq: Once | ORAL | Status: DC

## 2018-05-03 NOTE — ED Notes (Signed)
Pt very tearful, states "i've lost so many people to pneumonia". Emotional reassurance provided, kleenex provided.

## 2018-05-03 NOTE — ED Triage Notes (Signed)
Pt to ED with c/o of fever at home and productive cough. Pt states other family members have had similar symptoms.

## 2018-05-03 NOTE — ED Provider Notes (Signed)
[facility] Emergency Department Provider Note  ____________________________________________  Time seen: Approximately 8:23 PM  I have reviewed the triage vital signs and the nursing notes.   HISTORY  Chief Complaint Fever and Cough    HPI Peggy King is a 33 y.o. female presents to the emergency department with fever, chills, malaise, shortness of breath and nonproductive cough for the past 5 days.  Patient has sick contacts in the home with similar symptoms.  Patient has also experienced lightheadedness and fatigue.  Patient has been taking Tylenol and ibuprofen for fever.  She denies chest tightness or chest pain.  No recent travel.  No rash.  No neck pain.  No alleviating measures have been attempted.   Past Medical History:  Diagnosis Date  . Bronchitis   . Eustachian tube dysfunction   . History of frequent ear infections     There are no active problems to display for this patient.   History reviewed. No pertinent surgical history.  Prior to Admission medications   Medication Sig Start Date End Date Taking? Authorizing Provider  azithromycin (ZITHROMAX Z-PAK) 250 MG tablet Take 2 tablets (500 mg) on  Day 1,  followed by 1 tablet (250 mg) once daily on Days 2 through 5. 05/03/18 05/08/18  Orvil Feil, PA-C  loratadine (CLARITIN) 10 MG tablet Take 1 tablet (10 mg total) by mouth daily. 10/29/17 10/29/18  Enid Derry, PA-C  traMADol (ULTRAM) 50 MG tablet Take 1 tablet (50 mg total) by mouth every 6 (six) hours as needed. 07/15/15   Rebecka Apley, MD    Allergies Penicillins; Prednisone; and Prednisone  History reviewed. No pertinent family history.  Social History Social History   Tobacco Use  . Smoking status: Current Every Day Smoker  . Smokeless tobacco: Never Used  Substance Use Topics  . Alcohol use: Yes    Comment: rare  . Drug use: No     Review of Systems  Constitutional: Patient has fever.  Eyes: No visual changes.  No discharge ENT: No upper respiratory complaints. Cardiovascular: no chest pain. Respiratory: Patient has cough. No SOB. Gastrointestinal: No abdominal pain.  No nausea, no vomiting.  No diarrhea.  No constipation. Genitourinary: Negative for dysuria. No hematuria Musculoskeletal: Negative for musculoskeletal pain. Skin: Negative for rash, abrasions, lacerations, ecchymosis. Neurological: Patient has vertigo.    ____________________________________________   PHYSICAL EXAM:  VITAL SIGNS: ED Triage Vitals  Enc Vitals Group     BP 05/03/18 1803 105/70     Pulse --      Resp 05/03/18 1803 18     Temp 05/03/18 1803 98.3 F (36.8 C)     Temp Source 05/03/18 1803 Oral     SpO2 05/03/18 1803 99 %     Weight 05/03/18 1806 118 lb (53.5 kg)     Height 05/03/18 1806 5\' 2"  (1.575 m)     Head Circumference --      Peak Flow --      Pain Score 05/03/18 1806 7     Pain Loc --      Pain Edu? --      Excl. in GC? --      Constitutional: Alert and oriented. Patient is lying supine. Eyes: Conjunctivae are normal. PERRL. EOMI. Head: Atraumatic. ENT:      Ears: Tympanic membranes are mildly injected with mild effusion bilaterally.       Nose: No congestion/rhinnorhea.      Mouth/Throat: Mucous membranes are moist. Posterior pharynx is  mildly erythematous.  Hematological/Lymphatic/Immunilogical: No cervical lymphadenopathy.  Cardiovascular: Normal rate, regular rhythm. Normal S1 and S2.  Good peripheral circulation. Respiratory: Normal respiratory effort without tachypnea or retractions. Lungs CTAB. Good air entry to the bases with no decreased or absent breath sounds. Gastrointestinal: Bowel sounds 4 quadrants. Soft and nontender to palpation. No guarding or rigidity. No palpable masses. No distention. No CVA tenderness. Musculoskeletal: Full range of motion to all extremities. No gross deformities appreciated. Neurologic:  Normal speech and language. No gross focal neurologic deficits  are appreciated.  Skin:  Skin is warm, dry and intact. No rash noted. Psychiatric: Mood and affect are normal. Speech and behavior are normal. Patient exhibits appropriate insight and judgement.     ____________________________________________   LABS (all labs ordered are listed, but only abnormal results are displayed)  Labs Reviewed  CBC WITH DIFFERENTIAL/PLATELET - Abnormal; Notable for the following components:      Result Value   WBC 3.6 (*)    All other components within normal limits  BASIC METABOLIC PANEL - Abnormal; Notable for the following components:   Potassium 3.3 (*)    Calcium 8.5 (*)    All other components within normal limits  INFLUENZA PANEL BY PCR (TYPE A & B) - Abnormal; Notable for the following components:   Influenza B By PCR POSITIVE (*)    All other components within normal limits   ____________________________________________  EKG   ____________________________________________  RADIOLOGY I personally viewed and evaluated these images as part of my medical decision making, as well as reviewing the written report by the radiologist.  Dg Chest 2 View  Result Date: 05/03/2018 CLINICAL DATA:  Cough and fever. EXAM: CHEST - 2 VIEW COMPARISON:  10/30/2013 FINDINGS: Bronchial thickening. Few scattered ill-defined densities in the right lower lung zone likely pneumonitis/pneumonia. Normal heart size and mediastinal contours. No pulmonary edema, pleural effusion or pneumothorax. No acute osseous abnormalities. External artifact over the anterior chest. IMPRESSION: Bronchial thickening consistent with bronchitis or asthma. Scattered ill-defined densities in the right lower lung zone likely pneumonitis/pneumonia. Electronically Signed   By: Narda Rutherford M.D.   On: 05/03/2018 19:29    ____________________________________________    PROCEDURES  Procedure(s) performed:    Procedures    Medications  acetaminophen (TYLENOL) tablet 1,000 mg (1,000  mg Oral Given 05/03/18 2203)     ____________________________________________   INITIAL IMPRESSION / ASSESSMENT AND PLAN / ED COURSE  Pertinent labs & imaging results that were available during my care of the patient were reviewed by me and considered in my medical decision making (see chart for details).  Review of the [place] CSRS was performed in accordance of the [facility] prior to dispensing any controlled drugs.      Assessment and Plan:  Influenza B Pneumonia Patient presents to the emergency department with flulike symptoms for the past 5 days.  Chest x-ray shows early opacities in the lower lobe of right lung.  Patient is outside of the therapeutic window for Tamiflu at this time.  CBC was consistent with viral infection.  BMP was reassuring.  Rest and hydration were encouraged at home.  Patient was discharged with azithromycin.  She was advised to follow-up with primary care to assess for symptomatic improvement.  All patient questions were answered.   ____________________________________________  FINAL CLINICAL IMPRESSION(S) / ED DIAGNOSES  Final diagnoses:  Influenza B      NEW MEDICATIONS STARTED DURING THIS VISIT:  ED Discharge Orders  Ordered    azithromycin (ZITHROMAX Z-PAK) 250 MG tablet     05/03/18 2148              This chart was dictated using voice recognition software/Dragon. Despite best efforts to proofread, errors can occur which can change the meaning. Any change was purely unintentional.    Orvil FeilWoods, Karmela Bram M, PA-C 05/03/18 2219    Nita SickleVeronese, [place], MD 05/04/18 [phone]

## 2022-06-22 ENCOUNTER — Emergency Department: Payer: 59

## 2022-06-22 ENCOUNTER — Emergency Department
Admission: EM | Admit: 2022-06-22 | Discharge: 2022-06-22 | Disposition: A | Discharge status: Home / Self Care | Payer: 59 | Attending: Emergency Medicine | Admitting: Emergency Medicine

## 2022-06-22 ENCOUNTER — Encounter: Payer: Self-pay | Admitting: Emergency Medicine

## 2022-06-22 DIAGNOSIS — R002 Palpitations: Secondary | ICD-10-CM

## 2022-06-22 LAB — BASIC METABOLIC PANEL
Anion gap: 9 (ref 5–15)
BUN: 8 mg/dL (ref 6–20)
CO2: 24 mmol/L (ref 22–32)
Calcium: 9 mg/dL (ref 8.9–10.3)
Chloride: 102 mmol/L (ref 98–111)
Creatinine, Ser: 0.62 mg/dL (ref 0.44–1.00)
GFR, Estimated: >60 mL/min (ref >60)
Glucose, Bld: 108 mg/dL — ABNORMAL HIGH (ref 70–99)
Potassium: 3.1 mmol/L — ABNORMAL LOW (ref 3.5–5.1)
Sodium: 135 mmol/L (ref 135–145)

## 2022-06-22 LAB — CBC
HCT: 41.3 % (ref 36.0–46.0)
Hemoglobin: 13.9 g/dL (ref 12.0–15.0)
MCH: 30.6 pg (ref 26.0–34.0)
MCHC: 33.7 g/dL (ref 30.0–36.0)
MCV: 91 fL (ref 80.0–100.0)
Platelets: 391 10*3/uL (ref 150–400)
RBC: 4.54 MIL/uL (ref 3.87–5.11)
RDW: 12.4 % (ref 11.5–15.5)
WBC: 10.8 10*3/uL — ABNORMAL HIGH (ref 4.0–10.5)
nRBC: 0 % (ref 0.0–0.2)

## 2022-06-22 LAB — POC URINE PREG, ED: Preg Test, Ur: NEGATIVE

## 2022-06-22 LAB — TROPONIN I (HIGH SENSITIVITY): Troponin I (High Sensitivity): <2 ng/L (ref <18)

## 2022-06-22 MED ORDER — PROPRANOLOL HCL 10 MG PO TABS: ORAL_TABLET | ORAL | 0 refills | Status: DC

## 2022-06-22 NOTE — ED Triage Notes (Signed)
Pt presents ambulatory to triage via POV with complaints of palpitations with associated dizziness. She notes having two separate episodes of palpitations once 2 days ago and one again tonight that lasted 20 mins ago. Denies CP or SOB. No recent changes to her medication regimen or routine. A&Ox4 at this time.

## 2022-06-22 NOTE — ED Provider Notes (Signed)
[facility] Provider Note    Event Date/Time   First MD Initiated Contact with Patient 06/22/22 2049     (approximate)  History   Chief Complaint: Palpitations  HPI  Eisha Conrath is a 37 y.o. female with no significant past medical history presents to the emergency department for palpitations.  According to the patient 2 or 3 days ago she felt palpitations felt like her heart was fluttering in her chest lasting several minutes and then resolved.  Patient states today she has had 2 episodes that lasted 20 or so minutes each time she feels like her heart is skipping beats.  Denies any chest pain or shortness of breath at any point.  Physical Exam   Triage Vital Signs: ED Triage Vitals  Enc Vitals Group     BP 06/22/22 2014 114/81     Pulse Rate 06/22/22 2014 94     Resp 06/22/22 2014 18     Temp 06/22/22 2014 98 F (36.7 C)     Temp src --      SpO2 06/22/22 2014 100 %     Weight 06/22/22 2012 130 lb (59 kg)     Height 06/22/22 2012 5\' 2"  (1.575 m)     Head Circumference --      Peak Flow --      Pain Score 06/22/22 2012 0     Pain Loc --      Pain Edu? --      Excl. in [place]? --     Most recent vital signs: Vitals:   06/22/22 2050 06/22/22 2055  BP: 113/73   Pulse: 83 82  Resp: 17   Temp:    SpO2: 100% 99%    General: Awake, no distress.  CV:  Good peripheral perfusion.  Regular rate and rhythm  Resp:  Normal effort.  Equal breath sounds bilaterally.  Abd:  No distention.  Soft, nontender.  No rebound or guarding.   ED Results / Procedures / Treatments   EKG  EKG viewed and interpreted by myself shows a normal sinus rhythm at 91 bpm with a narrow QRS, normal axis, normal intervals, no concerning ST changes.  RADIOLOGY  I have reviewed and interpreted chest x-ray images.  No consolidation seen on my evaluation.   MEDICATIONS ORDERED IN ED: Medications - No data to display   IMPRESSION / MDM / [place] / ED COURSE  I  reviewed the triage vital signs and the nursing notes.  Patient's presentation is most consistent with acute presentation with potential threat to life or bodily function.  Patient presents to the emergency department for intermittent palpitations.  Overall the patient appears well, no distress.  Patient's lab work shows a reassuring CBC, reassuring chemistry, negative troponin.  Chest x-ray and EKG are reassuring as well.  I watched the patient on the monitor, so far no concerning findings.  Patient denies any symptoms currently.  Discussed with the patient we will watch her for approximate hour or so on the telemetry if she has any symptoms she is to let me know.  Differential would include PVCs, PACs, ectopic beats, other arrhythmias.  If the patient's workup and monitoring are normal anticipate likely discharge home with cardiology follow-up for consideration of a Holter monitor.  Patient agreeable to plan.  Patient did have an episode of palpitations I reviewed the rhythm strip appeared to have several PACs.  I discussed with the patient a trial of a very low-dose of  propranolol if needed for significant symptoms/palpitations lasting over 10 minutes.  I also discussed with the patient the importance of limiting caffeine as the patient states she drinks soda all day throughout the day.  I still discussed with the patient my recommendation to follow-up with cardiology for a Holter monitor to be placed.  Patient agreeable to plan of care.  Provided my typical palpitation return precautions.    FINAL CLINICAL IMPRESSION(S) / ED DIAGNOSES   Palpitations    Note:  This document was prepared using Dragon voice recognition software and may include unintentional dictation errors.   Harvest Dark, MD 06/22/22 2142

## 2022-06-22 NOTE — Discharge Instructions (Signed)
Please call the number provided for cardiology to arrange a follow-up appointment soon as possible.  Return to the emergency department for any further significant palpitations any chest pain trouble breathing or any other symptom personally concerning to yourself.  You have been prescribed propranolol which can be used up to once daily if needed for palpitations lasting over 15 minutes.  If you have any chest pain or trouble breathing please return to the emergency department immediately.

## 2022-06-22 NOTE — ED Notes (Signed)
Provider advised to discontinue collection of Troponin due at 2213

## 2022-07-19 ENCOUNTER — Ambulatory Visit (INDEPENDENT_AMBULATORY_CARE_PROVIDER_SITE_OTHER): Payer: 59 | Admitting: Cardiovascular Disease

## 2022-07-19 ENCOUNTER — Encounter: Payer: Self-pay | Admitting: Cardiovascular Disease

## 2022-07-19 VITALS — BP 90/60 | HR 101 | Ht 62.0 in | Wt 136.4 lb

## 2022-07-19 DIAGNOSIS — R002 Palpitations: Secondary | ICD-10-CM

## 2022-07-19 NOTE — Progress Notes (Signed)
Cardiology Office Note   Date:  07/19/2022   ID:  Peggy King, DOB April 11, 1985, MRN [id number]  PCP:  Patient, No Pcp Per  Cardiologist:  Adrian Blackwater, MD      History of Present Illness: Peggy King is a 37 y.o. female who presents for  Chief Complaint  Patient presents with   Follow-up    Cardiac consult    Patient in office to establish care. Complaining of palpitations. Patient states she has decreased caffeine consumption since ED visit on 06/22/22, palpitations have improved. Has not taken propranolol that was prescribed during ED visit.   Palpitations  This is a new problem. The current episode started more than 1 month ago. The problem occurs intermittently. The problem has been waxing and waning. The symptoms are aggravated by caffeine. Associated symptoms include dizziness. She has tried nothing for the symptoms. Risk factors include smoking/tobacco exposure.    Past Medical History:  Diagnosis Date   Bronchitis    Eustachian tube dysfunction    History of frequent ear infections      History reviewed. No pertinent surgical history.   Current Outpatient Medications  Medication Sig Dispense Refill   levonorgestrel (MIRENA, 52 MG,) 20 MCG/DAY IUD 1 each by Intrauterine route once.     No current facility-administered medications for this visit.    Allergies:   Penicillins, Prednisone, and Prednisone    Social History:   reports that she has been smoking. She has never used smokeless tobacco. She reports current alcohol use. She reports that she does not use drugs.   Family History:  family history is not on file.    ROS:     Review of Systems  Constitutional: Negative.   HENT: Negative.    Eyes: Negative.   Respiratory: Negative.    Cardiovascular:  Positive for palpitations.  Gastrointestinal: Negative.   Genitourinary: Negative.   Musculoskeletal: Negative.   Skin: Negative.   Neurological:  Positive for dizziness.  Endo/Heme/Allergies:  Negative.   Psychiatric/Behavioral: Negative.    All other systems reviewed and are negative.   All other systems are reviewed and negative.   PHYSICAL EXAM: VS:  BP 90/60   Pulse (!) 101   Ht 5\' 2"  (1.575 m)   Wt 136 lb 6.4 oz (61.9 kg)   SpO2 97%   BMI 24.95 kg/m  , BMI Body mass index is 24.95 kg/m. Last weight:  Wt Readings from Last 3 Encounters:  07/19/22 136 lb 6.4 oz (61.9 kg)  06/22/22 130 lb (59 kg)  05/03/18 118 lb (53.5 kg)    Physical Exam Constitutional:      Appearance: Normal appearance.  Cardiovascular:     Rate and Rhythm: Normal rate and regular rhythm.     Heart sounds: Normal heart sounds.  Pulmonary:     Effort: Pulmonary effort is normal.     Breath sounds: Normal breath sounds.  Musculoskeletal:     Right lower leg: No edema.     Left lower leg: No edema.  Neurological:     Mental Status: She is alert.     EKG: 06/22/22 [facility] normal sinus rhythm, HR 91 bpm  Recent Labs: 06/22/2022: BUN 8; Creatinine, Ser 0.62; Hemoglobin 13.9; Platelets 391; Potassium 3.1; Sodium 135    Lipid Panel No results found for: "CHOL", "TRIG", "HDL", "CHOLHDL", "VLDL", "LDLCALC", "LDLDIRECT"    Other studies Reviewed: none today   ASSESSMENT AND PLAN:    ICD-10-CM   1. Palpitations  R00.2 PCV ECHOCARDIOGRAM  COMPLETE    Holter monitor - 72 hour    Lipid Profile    TSH    CMP14+EGFR    CBC with Differential/Platelet       Problem List Items Addressed This Visit       Other   Palpitations - Primary    Patient reports palpitations have improved since decreasing caffeine consumption. Will order an echo to check heart function. Holter monitor to check for arrhythmias. Patient does not have a family doctor. Will order labs to check thyroid, electrolytes, blood count.       Relevant Orders   PCV ECHOCARDIOGRAM COMPLETE   Holter monitor - 72 hour   Lipid Profile   TSH   CMP14+EGFR   CBC with Differential/Platelet    Disposition:   Return in about 4  weeks (around 08/16/2022) for after echo, holter .    Total time spent: 30 minutes  Signed,  Adrian Blackwater, MD  07/19/2022 11:34 AM    [facility]

## 2022-07-19 NOTE — Assessment & Plan Note (Signed)
Patient reports palpitations have improved since decreasing caffeine consumption. Will order an echo to check heart function. Holter monitor to check for arrhythmias. Patient does not have a family doctor. Will order labs to check thyroid, electrolytes, blood count.

## 2022-07-25 ENCOUNTER — Telehealth: Payer: Self-pay | Admitting: Cardiovascular Disease

## 2022-07-25 NOTE — Telephone Encounter (Signed)
Patient called regarding Holter monitor. Needs authorization done. Please advise.

## 2022-07-30 ENCOUNTER — Other Ambulatory Visit: Payer: 59

## 2022-07-30 ENCOUNTER — Ambulatory Visit (INDEPENDENT_AMBULATORY_CARE_PROVIDER_SITE_OTHER): Payer: 59

## 2022-07-30 DIAGNOSIS — I361 Nonrheumatic tricuspid (valve) insufficiency: Secondary | ICD-10-CM

## 2022-07-30 DIAGNOSIS — R002 Palpitations: Secondary | ICD-10-CM

## 2022-07-31 LAB — CBC WITH DIFFERENTIAL/PLATELET
Basophils Absolute: 0 10*3/uL (ref 0.0–0.2)
Basos: 1 %
EOS (ABSOLUTE): 0.1 10*3/uL (ref 0.0–0.4)
Eos: 2 %
Hematocrit: 41.8 % (ref 34.0–46.6)
Hemoglobin: 14.3 g/dL (ref 11.1–15.9)
Immature Grans (Abs): 0 10*3/uL (ref 0.0–0.1)
Immature Granulocytes: 0 %
Lymphocytes Absolute: 1.6 10*3/uL (ref 0.7–3.1)
Lymphs: 31 %
MCH: 31.2 pg (ref 26.6–33.0)
MCHC: 34.2 g/dL (ref 31.5–35.7)
MCV: 91 fL (ref 79–97)
Monocytes Absolute: 0.4 10*3/uL (ref 0.1–0.9)
Monocytes: 8 %
Neutrophils Absolute: 3 10*3/uL (ref 1.4–7.0)
Neutrophils: 58 %
Platelets: 365 10*3/uL (ref 150–450)
RBC: 4.59 x10E6/uL (ref 3.77–5.28)
RDW: 12.4 % (ref 11.7–15.4)
WBC: 5.1 10*3/uL (ref 3.4–10.8)

## 2022-07-31 LAB — CMP14+EGFR
ALT: 24 IU/L (ref 0–32)
AST: 13 IU/L (ref 0–40)
Albumin/Globulin Ratio: 1.9 (ref 1.2–2.2)
Albumin: 4.2 g/dL (ref 3.9–4.9)
Alkaline Phosphatase: 118 IU/L (ref 44–121)
BUN/Creatinine Ratio: 10 (ref 9–23)
BUN: 7 mg/dL (ref 6–20)
Bilirubin Total: 0.4 mg/dL (ref 0.0–1.2)
CO2: 24 mmol/L (ref 20–29)
Calcium: 9.2 mg/dL (ref 8.7–10.2)
Chloride: 103 mmol/L (ref 96–106)
Creatinine, Ser: 0.69 mg/dL (ref 0.57–1.00)
Globulin, Total: 2.2 g/dL (ref 1.5–4.5)
Glucose: 82 mg/dL (ref 70–99)
Potassium: 4.6 mmol/L (ref 3.5–5.2)
Sodium: 141 mmol/L (ref 134–144)
Total Protein: 6.4 g/dL (ref 6.0–8.5)
eGFR: 116 mL/min/{1.73_m2} (ref >59)

## 2022-07-31 LAB — LIPID PANEL
Chol/HDL Ratio: 4.3 ratio (ref 0.0–4.4)
Cholesterol, Total: 200 mg/dL — ABNORMAL HIGH (ref 100–199)
HDL: 46 mg/dL (ref >39)
LDL Chol Calc (NIH): 136 mg/dL — ABNORMAL HIGH (ref 0–99)
Triglycerides: 100 mg/dL (ref 0–149)
VLDL Cholesterol Cal: 18 mg/dL (ref 5–40)

## 2022-07-31 LAB — TSH: TSH: 0.984 u[IU]/mL (ref 0.450–4.500)

## 2022-08-17 ENCOUNTER — Encounter: Payer: Self-pay | Admitting: Cardiovascular Disease

## 2022-08-17 ENCOUNTER — Ambulatory Visit (INDEPENDENT_AMBULATORY_CARE_PROVIDER_SITE_OTHER): Payer: 59 | Admitting: Cardiovascular Disease

## 2022-08-17 VITALS — BP 104/76 | HR 90 | Ht 62.0 in | Wt 135.4 lb

## 2022-08-17 DIAGNOSIS — I340 Nonrheumatic mitral (valve) insufficiency: Secondary | ICD-10-CM

## 2022-08-17 DIAGNOSIS — R002 Palpitations: Secondary | ICD-10-CM | POA: Diagnosis not present

## 2022-08-17 NOTE — Progress Notes (Unsigned)
Cardiology Office Note   Date:  08/17/2022   ID:  Peggy King, DOB Feb 19, 1986, MRN [id number]  PCP:  Patient, No Pcp Per  Cardiologist:  Adrian Blackwater, MD      History of Present Illness: Peggy King is a 37 y.o. female who presents for  Chief Complaint  Patient presents with   Follow-up    1 month follow up    Feeling better      Past Medical History:  Diagnosis Date   Bronchitis    Eustachian tube dysfunction    History of frequent ear infections      History reviewed. No pertinent surgical history.   Current Outpatient Medications  Medication Sig Dispense Refill   levonorgestrel (MIRENA, 52 MG,) 20 MCG/DAY IUD 1 each by Intrauterine route once.     nystatin-triamcinolone ointment (MYCOLOG) Apply 1 Application topically 2 (two) times daily.     No current facility-administered medications for this visit.    Allergies:   Penicillins, Prednisone, and Prednisone    Social History:   reports that she has been smoking. She has never used smokeless tobacco. She reports current alcohol use. She reports that she does not use drugs.   Family History:  family history is not on file.    ROS:     Review of Systems  Constitutional: Negative.   HENT: Negative.    Eyes: Negative.   Respiratory: Negative.    Gastrointestinal: Negative.   Genitourinary: Negative.   Musculoskeletal: Negative.   Skin: Negative.   Neurological: Negative.   Endo/Heme/Allergies: Negative.   Psychiatric/Behavioral: Negative.    All other systems reviewed and are negative.     All other systems are reviewed and negative.    PHYSICAL EXAM: VS:  BP 104/76   Pulse 90   Ht 5\' 2"  (1.575 m)   Wt 135 lb 6.4 oz (61.4 kg)   SpO2 98%   BMI 24.76 kg/m  , BMI Body mass index is 24.76 kg/m. Last weight:  Wt Readings from Last 3 Encounters:  08/17/22 135 lb 6.4 oz (61.4 kg)  07/19/22 136 lb 6.4 oz (61.9 kg)  06/22/22 130 lb (59 kg)     Physical Exam Constitutional:       Appearance: Normal appearance.  Cardiovascular:     Rate and Rhythm: Normal rate and regular rhythm.     Heart sounds: Normal heart sounds.  Pulmonary:     Effort: Pulmonary effort is normal.     Breath sounds: Normal breath sounds.  Musculoskeletal:     Right lower leg: No edema.     Left lower leg: No edema.  Neurological:     Mental Status: She is alert.     EKG: none today  Recent Labs: 07/30/2022: ALT 24; BUN 7; Creatinine, Ser 0.69; Hemoglobin 14.3; Platelets 365; Potassium 4.6; Sodium 141; TSH 0.984    Lipid Panel    Component Value Date/Time   CHOL 200 (H) 07/30/2022 0917   TRIG 100 07/30/2022 0917   HDL 46 07/30/2022 0917   CHOLHDL 4.3 07/30/2022 0917   LDLCALC 136 (H) 07/30/2022 0917    ASSESSMENT AND PLAN:    ICD-10-CM   1. Palpitations  R00.2    Holter not done, but with not taking Cffeine, no futher episodes of palpitatation.    2. Nonrheumatic mitral valve regurgitation  I34.0    ECHO had mild MR/TR, normal LVEF       Problem List Items Addressed This Visit  Other   Palpitations - Primary   Other Visit Diagnoses     Nonrheumatic mitral valve regurgitation       ECHO had mild MR/TR, normal LVEF        Disposition:   Return in about 3 months (around 11/17/2022).    Total time spent: 30 minutes  Signed,  Adrian Blackwater, MD  08/17/2022 10:09 AM    [facility]

## 2022-08-24 ENCOUNTER — Encounter: Payer: Self-pay | Admitting: Internal Medicine

## 2022-08-24 ENCOUNTER — Ambulatory Visit: Payer: 59 | Admitting: Nurse Practitioner

## 2022-08-24 ENCOUNTER — Ambulatory Visit (INDEPENDENT_AMBULATORY_CARE_PROVIDER_SITE_OTHER): Payer: 59 | Admitting: Internal Medicine

## 2022-08-24 VITALS — BP 100/60 | HR 86 | Ht 62.0 in | Wt 134.8 lb

## 2022-08-24 DIAGNOSIS — E782 Mixed hyperlipidemia: Secondary | ICD-10-CM | POA: Diagnosis not present

## 2022-08-24 DIAGNOSIS — R002 Palpitations: Secondary | ICD-10-CM

## 2022-08-24 DIAGNOSIS — J301 Allergic rhinitis due to pollen: Secondary | ICD-10-CM | POA: Diagnosis not present

## 2022-08-24 NOTE — Progress Notes (Signed)
New Patient Office Visit  Subjective    Patient ID: Peggy King, female    DOB: 1985-07-23  Age: 37 y.o. MRN: [id number]  CC:  Chief Complaint  Patient presents with   Establish Care    Sees [place]...establishing PCP    HPI Peggy King presents to establish care Previous Primary Care provider/office:   she does not have additional concerns to discuss today.   Patient is here to establish PMD.  She is already under the care of cardiology at this office for palpitations.  Patient was seen in the emergency room with rapid heartbeat in March 2024.  She was evaluated and  considered that her palpitations were related to her caffeine intake.  Since then she has cut down significantly on her caffeine use and has not had any further palpitations. Patient has history of frequent middle ear infections in the past but recently she is using her Flonase nasal spray and allergy medicine to prevent further episodes. Patient has also extensive dental surgery done to remove her teeth and that has helped prevent her ear pain. Today patient is feeling well and she has no new complaints. She has an IUD and does not get regular menstrual cycles.  Her last Pap was 1 week ago at her OB/GYN. Recent labs have shown High LDL and HDL. Patient now trying to control her diet and increase exercise. Will repeat in 6 months.    Outpatient Encounter Medications as of 08/24/2022  Medication Sig   levonorgestrel (MIRENA, 52 MG,) 20 MCG/DAY IUD 1 each by Intrauterine route once.   nystatin-triamcinolone ointment (MYCOLOG) Apply 1 Application topically 2 (two) times daily.   No facility-administered encounter medications on file as of 08/24/2022.    Past Medical History:  Diagnosis Date   Bronchitis    Eustachian tube dysfunction    History of frequent ear infections     Past Surgical History:  Procedure Laterality Date   Dental extractions      Family History  Problem Relation Age of Onset   COPD  Mother    Diabetes Mother    Psoriasis Mother    Acromegaly Sister    Growth hormone deficiency Brother     Social History   Socioeconomic History   Marital status: Single    Spouse name: Not on file   Number of children: Not on file   Years of education: Not on file   Highest education level: Not on file  Occupational History   Not on file  Tobacco Use   Smoking status: Former    Types: Cigarettes   Smokeless tobacco: Current  Substance and Sexual Activity   Alcohol use: Yes    Comment: rare   Drug use: No   Sexual activity: Not on file  Other Topics Concern   Not on file  Social History Narrative   ** Merged History Encounter **       Social Determinants of Health   Financial Resource Strain: Not on file  Food Insecurity: Not on file  Transportation Needs: Not on file  Physical Activity: Not on file  Stress: Not on file  Social Connections: Not on file  Intimate Partner Violence: Not on file    Review of Systems  Constitutional:  Negative for chills, diaphoresis, fever, malaise/fatigue and weight loss.  HENT:  Negative for congestion, ear discharge, ear pain, hearing loss, sinus pain, sore throat and tinnitus.   Eyes:  Negative for blurred vision, double vision, pain and discharge.  Respiratory:  Negative for cough, shortness of breath, wheezing and stridor.   Cardiovascular:  Negative for chest pain, palpitations, orthopnea, claudication and PND.  Gastrointestinal:  Negative for abdominal pain, blood in stool, heartburn, nausea and vomiting.  Genitourinary:  Negative for dysuria, flank pain, frequency and urgency.  Musculoskeletal:  Negative for back pain, falls, joint pain, myalgias and neck pain.  Skin: Negative.   Neurological:  Negative for dizziness, tingling, tremors, seizures, loss of consciousness and headaches.  Psychiatric/Behavioral:  Negative for depression. The patient is not nervous/anxious and does not have insomnia.         Objective     BP 100/60   Pulse 86   Ht 5\' 2"  (1.575 m)   Wt 134 lb 12.8 oz (61.1 kg)   SpO2 98%   BMI 24.66 kg/m   Physical Exam Vitals and nursing note reviewed.  Constitutional:      Appearance: Normal appearance.  HENT:     Head: Normocephalic and atraumatic.     Right Ear: Hearing, ear canal and external ear normal. A middle ear effusion is present. There is no impacted cerumen. Tympanic membrane is not injected, erythematous or bulging.     Left Ear: Hearing, ear canal and external ear normal. A middle ear effusion is present. There is no impacted cerumen. Tympanic membrane is not injected, erythematous or bulging.     Nose: Nose normal. No congestion.     Mouth/Throat:     Mouth: Mucous membranes are moist.     Pharynx: Oropharynx is clear. No oropharyngeal exudate or posterior oropharyngeal erythema.  Cardiovascular:     Rate and Rhythm: Normal rate and regular rhythm.     Pulses: Normal pulses.     Heart sounds: Normal heart sounds. No murmur heard. Pulmonary:     Effort: Pulmonary effort is normal. No respiratory distress.     Breath sounds: Normal breath sounds. No wheezing or rales.  Abdominal:     General: Bowel sounds are normal. There is no distension.     Palpations: Abdomen is soft. There is no mass.     Tenderness: There is no abdominal tenderness. There is no right CVA tenderness, left CVA tenderness or rebound.     Hernia: No hernia is present.  Musculoskeletal:        General: No swelling or tenderness.     Cervical back: Normal range of motion and neck supple. No tenderness.     Right lower leg: No edema.     Left lower leg: No edema.  Skin:    General: Skin is warm and dry.  Neurological:     General: No focal deficit present.     Mental Status: She is alert and oriented to person, place, and time.  Psychiatric:        Mood and Affect: Mood normal.        Behavior: Behavior normal.        Thought Content: Thought content normal.        Assessment &  Plan:   Problem List Items Addressed This Visit     Palpitations   Mixed hyperlipidemia - Primary   Seasonal allergic rhinitis due to pollen    Return in about 6 months (around 02/24/2023).   Total time spent: 40 minutes  [name], MD  08/24/2022   This document may have been prepared by [facility] Voice Recognition software and as such may include unintentional dictation errors.

## 2022-11-16 ENCOUNTER — Ambulatory Visit: Payer: 59 | Admitting: Cardiovascular Disease

## 2022-11-29 ENCOUNTER — Ambulatory Visit: Payer: 59 | Admitting: Cardiovascular Disease

## 2023-02-25 ENCOUNTER — Ambulatory Visit: Payer: 59 | Admitting: Internal Medicine

## 2023-04-16 ENCOUNTER — Encounter: Payer: Self-pay | Admitting: Internal Medicine

## 2023-04-16 ENCOUNTER — Ambulatory Visit (INDEPENDENT_AMBULATORY_CARE_PROVIDER_SITE_OTHER): Payer: 59 | Admitting: Internal Medicine

## 2023-04-16 VITALS — BP 100/72 | HR 106 | Temp 98.0°F | Ht 62.0 in | Wt 141.2 lb

## 2023-04-16 DIAGNOSIS — R21 Rash and other nonspecific skin eruption: Secondary | ICD-10-CM

## 2023-04-16 DIAGNOSIS — J00 Acute nasopharyngitis [common cold]: Secondary | ICD-10-CM | POA: Diagnosis not present

## 2023-04-16 DIAGNOSIS — Z0130 Encounter for examination of blood pressure without abnormal findings: Secondary | ICD-10-CM

## 2023-04-16 DIAGNOSIS — E782 Mixed hyperlipidemia: Secondary | ICD-10-CM

## 2023-04-16 LAB — POCT XPERT XPRESS SARS COVID-2/FLU/RSV
FLU A: NEGATIVE
FLU B: NEGATIVE
RSV RNA, PCR: NEGATIVE
SARS Coronavirus 2: NEGATIVE

## 2023-04-16 MED ORDER — DOXYCYCLINE HYCLATE 100 MG PO TABS: 100.0000 mg | ORAL_TABLET | Freq: Two times a day (BID) | ORAL | 0 refills | Status: AC

## 2023-04-16 MED ORDER — METRONIDAZOLE 0.75 % EX CREA: TOPICAL_CREAM | Freq: Two times a day (BID) | CUTANEOUS | 3 refills | Status: AC

## 2023-04-16 NOTE — Progress Notes (Signed)
 Established Patient Office Visit  Subjective:  Patient ID: Peggy King, female    DOB: 1985-09-22  Age: 38 y.o. MRN: [id number]  Chief Complaint  Patient presents with   Acute Visit    Congestion, sore throat, body aches, itchy throat.    Patient comes in with 2-day history of upper respiratory tract infection.  Reports of sinus congestion, postnasal drip, sore throat, ear pressures and now has a cough.  No fever but feels chills.  Patient easily gets an ear infection. There is no chest pain and no shortness of breath. Patient also notes a rash on her face, a butterfly kind of a rash but there are tiny bumps as well.  Will check for SLE, but also suspect rosacea.  Will start on MetroCream  for the face. COVID/flu/RSV tests are negative in the office.  Will send in prescription for doxycycline .    No other concerns at this time.   Past Medical History:  Diagnosis Date   Bronchitis    Eustachian tube dysfunction    History of frequent ear infections     Past Surgical History:  Procedure Laterality Date   Dental extractions      Social History   Socioeconomic History   Marital status: Single    Spouse name: Not on file   Number of children: Not on file   Years of education: Not on file   Highest education level: Not on file  Occupational History   Not on file  Tobacco Use   Smoking status: Former    Types: Cigarettes   Smokeless tobacco: Current  Substance and Sexual Activity   Alcohol use: Yes    Comment: rare   Drug use: No   Sexual activity: Not on file  Other Topics Concern   Not on file  Social History Narrative   ** Merged History Encounter **       Social Drivers of Health   Financial Resource Strain: Not on file  Food Insecurity: Not on file  Transportation Needs: Not on file  Physical Activity: Not on file  Stress: Not on file  Social Connections: Not on file  Intimate Partner Violence: Not on file    Family History  Problem Relation Age of  Onset   COPD Mother    Diabetes Mother    Psoriasis Mother    Acromegaly Sister    Growth hormone deficiency Brother     Allergies  Allergen Reactions   Penicillins    Prednisone    Prednisone Other (See Comments)    Temporary muscle paralysis    Outpatient Medications Prior to Visit  Medication Sig   levonorgestrel (MIRENA, 52 MG,) 20 MCG/DAY IUD 1 each by Intrauterine route once.   nystatin-triamcinolone ointment (MYCOLOG) Apply 1 Application topically 2 (two) times daily. (Patient not taking: Reported on 04/16/2023)   No facility-administered medications prior to visit.    Review of Systems  Constitutional: Negative.  Negative for chills, fever, malaise/fatigue and weight loss.  HENT:  Positive for congestion, ear pain, sinus pain and sore throat.   Eyes: Negative.   Respiratory:  Positive for cough. Negative for sputum production, shortness of breath and wheezing.   Cardiovascular: Negative.  Negative for chest pain, palpitations and leg swelling.  Gastrointestinal: Negative.  Negative for abdominal pain, constipation, diarrhea, heartburn, nausea and vomiting.  Genitourinary: Negative.  Negative for dysuria and flank pain.  Musculoskeletal: Negative.  Negative for joint pain and myalgias.  Skin:  Positive for rash (Butterfly rash on  face).  Neurological: Negative.  Negative for dizziness and headaches.  Endo/Heme/Allergies: Negative.   Psychiatric/Behavioral: Negative.  Negative for depression and suicidal ideas. The patient is not nervous/anxious.        Objective:   BP 100/72   Pulse (!) 106   Temp 98 F (36.7 C) (Tympanic)   Ht 5' 2 (1.575 m)   Wt 141 lb 3.2 oz (64 kg)   SpO2 95%   BMI 25.83 kg/m   Vitals:   04/16/23 0901  BP: 100/72  Pulse: (!) 106  Temp: 98 F (36.7 C)  Height: 5' 2 (1.575 m)  Weight: 141 lb 3.2 oz (64 kg)  SpO2: 95%  TempSrc: Tympanic  BMI (Calculated): 25.82    Physical Exam Vitals and nursing note reviewed.   Constitutional:      Appearance: Normal appearance.  HENT:     Head: Normocephalic and atraumatic.     Nose: Nose normal.     Mouth/Throat:     Mouth: Mucous membranes are moist.     Pharynx: Oropharynx is clear.  Eyes:     Conjunctiva/sclera: Conjunctivae normal.     Pupils: Pupils are equal, round, and reactive to light.  Cardiovascular:     Rate and Rhythm: Normal rate and regular rhythm.     Pulses: Normal pulses.     Heart sounds: Normal heart sounds. No murmur heard. Pulmonary:     Effort: Pulmonary effort is normal.     Breath sounds: Normal breath sounds. No wheezing.  Abdominal:     General: Bowel sounds are normal.     Palpations: Abdomen is soft.     Tenderness: There is no abdominal tenderness. There is no right CVA tenderness or left CVA tenderness.  Musculoskeletal:        General: Normal range of motion.     Cervical back: Normal range of motion.     Right lower leg: No edema.     Left lower leg: No edema.  Skin:    General: Skin is warm and dry.     Findings: Rash (red, with tiny papules in the shape of butterfly over nose and both cheeks) present.  Neurological:     General: No focal deficit present.     Mental Status: She is alert and oriented to person, place, and time.  Psychiatric:        Mood and Affect: Mood normal.        Behavior: Behavior normal.      Results for orders placed or performed in visit on 04/16/23  POCT XPERT XPRESS SARS COVID-2/FLU/RSV  Result Value Ref Range   SARS Coronavirus 2 neg    FLU A neg    FLU B neg    RSV RNA, PCR neg     Recent Results (from the past 2160 hours)  POCT XPERT XPRESS SARS COVID-2/FLU/RSV     Status: None   Collection Time: 04/16/23 10:40 AM  Result Value Ref Range   SARS Coronavirus 2 neg    FLU A neg    FLU B neg    RSV RNA, PCR neg       Assessment & Plan:  Check labs. Start doxycycline . Prescription sent for MetroCream  for the face. Problem List Items Addressed This Visit     Mixed  hyperlipidemia   Relevant Orders   Lipid Panel w/o Chol/HDL Ratio   Other Visit Diagnoses       Rash of face    -  Primary  Relevant Medications   metroNIDAZOLE  (METROCREAM ) 0.75 % cream   Other Relevant Orders   ANA 12 Plus Profile (RDL)     Acute nasopharyngitis       Relevant Medications   doxycycline  (VIBRA -TABS) 100 MG tablet   Other Relevant Orders   POCT XPERT XPRESS SARS COVID-2/FLU/RSV (Completed)       Return in about 10 days (around 04/26/2023).   Total time spent: 30 minutes  FERNAND FREDY RAMAN, MD  04/16/2023   This document may have been prepared by [facility] Voice Recognition software and as such may include unintentional dictation errors.

## 2023-04-16 NOTE — Progress Notes (Signed)
 Patient notified

## 2023-04-23 LAB — LIPID PANEL W/O CHOL/HDL RATIO
Cholesterol, Total: 193 mg/dL (ref 100–199)
HDL: 39 mg/dL — ABNORMAL LOW (ref >39)
LDL Chol Calc (NIH): 134 mg/dL — ABNORMAL HIGH (ref 0–99)
Triglycerides: 111 mg/dL (ref 0–149)
VLDL Cholesterol Cal: 20 mg/dL (ref 5–40)

## 2023-04-23 LAB — ANA 12 PLUS PROFILE (RDL): Anti-Nuclear Ab by IFA (RDL): NEGATIVE

## 2023-04-23 LAB — ANTI-RO/NEG ANA: Anti-Ro (SS-A) Ab (RDL): <20 U (ref <20)

## 2023-04-26 ENCOUNTER — Ambulatory Visit (INDEPENDENT_AMBULATORY_CARE_PROVIDER_SITE_OTHER): Payer: 59 | Admitting: Internal Medicine

## 2023-04-26 ENCOUNTER — Encounter: Payer: Self-pay | Admitting: Internal Medicine

## 2023-04-26 VITALS — BP 100/66 | HR 94 | Ht 62.0 in | Wt 140.0 lb

## 2023-04-26 DIAGNOSIS — L719 Rosacea, unspecified: Secondary | ICD-10-CM | POA: Diagnosis not present

## 2023-04-26 DIAGNOSIS — Z0130 Encounter for examination of blood pressure without abnormal findings: Secondary | ICD-10-CM

## 2023-04-26 NOTE — Progress Notes (Signed)
Established Patient Office Visit  Subjective:  Patient ID: Peggy King, female    DOB: January 14, 1986  Age: 38 y.o. MRN: [id number]  Chief Complaint  Patient presents with   Follow-up    10 day follow up    Patient comes in for her follow-up today.  She has completed doxycycline and her upper respiratory tract infection symptoms have resolved completely.  She is also using Metrocream on her face and there is a definite improvement in her rosacea.  Her lupus tests were negative.  Patient advised to continue using it twice a day and cover with a sunscreen.    No other concerns at this time.   Past Medical History:  Diagnosis Date   Bronchitis    Eustachian tube dysfunction    History of frequent ear infections     Past Surgical History:  Procedure Laterality Date   Dental extractions      Social History   Socioeconomic History   Marital status: Single    Spouse name: Not on file   Number of children: Not on file   Years of education: Not on file   Highest education level: Not on file  Occupational History   Not on file  Tobacco Use   Smoking status: Former    Types: Cigarettes   Smokeless tobacco: Current  Substance and Sexual Activity   Alcohol use: Yes    Comment: rare   Drug use: No   Sexual activity: Not on file  Other Topics Concern   Not on file  Social History Narrative   ** Merged History Encounter **       Social Drivers of Health   Financial Resource Strain: Not on file  Food Insecurity: Not on file  Transportation Needs: Not on file  Physical Activity: Not on file  Stress: Not on file  Social Connections: Not on file  Intimate Partner Violence: Not on file    Family History  Problem Relation Age of Onset   COPD Mother    Diabetes Mother    Psoriasis Mother    Acromegaly Sister    Growth hormone deficiency Brother     Allergies  Allergen Reactions   Penicillins    Prednisone    Prednisone Other (See Comments)    Temporary muscle  paralysis    Outpatient Medications Prior to Visit  Medication Sig   levonorgestrel (MIRENA, 52 MG,) 20 MCG/DAY IUD 1 each by Intrauterine route once.   metroNIDAZOLE (METROCREAM) 0.75 % cream Apply topically 2 (two) times daily.   doxycycline (VIBRA-TABS) 100 MG tablet Take 1 tablet (100 mg total) by mouth 2 (two) times daily. (Patient not taking: Reported on 04/26/2023)   nystatin-triamcinolone ointment (MYCOLOG) Apply 1 Application topically 2 (two) times daily. (Patient not taking: Reported on 04/26/2023)   No facility-administered medications prior to visit.    Review of Systems  Constitutional: Negative.  Negative for chills, fever, malaise/fatigue and weight loss.  HENT: Negative.  Negative for sinus pain and sore throat.   Eyes: Negative.   Respiratory: Negative.  Negative for cough and shortness of breath.   Cardiovascular: Negative.  Negative for chest pain, palpitations and leg swelling.  Gastrointestinal: Negative.  Negative for abdominal pain, constipation, diarrhea, heartburn, nausea and vomiting.  Genitourinary: Negative.  Negative for dysuria and flank pain.  Musculoskeletal: Negative.  Negative for joint pain and myalgias.  Skin:  Positive for rash. Negative for itching.  Neurological: Negative.  Negative for dizziness and headaches.  Endo/Heme/Allergies: Negative.  Psychiatric/Behavioral: Negative.  Negative for depression and suicidal ideas. The patient is not nervous/anxious.        Objective:   BP 100/66   Pulse 94   Ht 5\' 2"  (1.575 m)   Wt 140 lb (63.5 kg)   SpO2 99%   BMI 25.61 kg/m   Vitals:   04/26/23 1050  BP: 100/66  Pulse: 94  Height: 5\' 2"  (1.575 m)  Weight: 140 lb (63.5 kg)  SpO2: 99%  BMI (Calculated): 25.6    Physical Exam Vitals and nursing note reviewed.  Constitutional:      Appearance: Normal appearance.  HENT:     Head: Normocephalic and atraumatic.     Nose: Nose normal.     Mouth/Throat:     Mouth: Mucous membranes are  moist.     Pharynx: Oropharynx is clear.  Eyes:     Conjunctiva/sclera: Conjunctivae normal.     Pupils: Pupils are equal, round, and reactive to light.  Cardiovascular:     Rate and Rhythm: Normal rate and regular rhythm.     Pulses: Normal pulses.     Heart sounds: Normal heart sounds. No murmur heard. Pulmonary:     Effort: Pulmonary effort is normal.     Breath sounds: Normal breath sounds. No wheezing.  Abdominal:     General: Bowel sounds are normal.     Palpations: Abdomen is soft.     Tenderness: There is no abdominal tenderness. There is no right CVA tenderness or left CVA tenderness.  Musculoskeletal:        General: Normal range of motion.     Cervical back: Normal range of motion.     Right lower leg: No edema.     Left lower leg: No edema.  Skin:    General: Skin is warm and dry.  Neurological:     General: No focal deficit present.     Mental Status: She is alert and oriented to person, place, and time.  Psychiatric:        Mood and Affect: Mood normal.        Behavior: Behavior normal.      No results found for any visits on 04/26/23.  Recent Results (from the past 2160 hours)  Lipid Panel w/o Chol/HDL Ratio     Status: Abnormal   Collection Time: 04/16/23  9:52 AM  Result Value Ref Range   Cholesterol, Total 193 100 - 199 mg/dL   Triglycerides 409 0 - 149 mg/dL   HDL 39 (L) >81 mg/dL   VLDL Cholesterol Cal 20 5 - 40 mg/dL   LDL Chol Calc (NIH) 191 (H) 0 - 99 mg/dL  ANA 12 Plus Profile (RDL)     Status: None   Collection Time: 04/16/23  9:52 AM  Result Value Ref Range   Anti-Nuclear Ab by IFA (RDL) Negative Negative  Anti-Ro/Neg ANA     Status: None   Collection Time: 04/16/23  9:52 AM  Result Value Ref Range   Anti-Ro (SS-A) Ab (RDL) <20 <20 Units  POCT XPERT XPRESS SARS COVID-2/FLU/RSV     Status: None   Collection Time: 04/16/23 10:40 AM  Result Value Ref Range   SARS Coronavirus 2 neg    FLU A neg    FLU B neg    RSV RNA, PCR neg        Assessment & Plan:  Patient advised to continue MetroCream for the face twice a day. Problem List Items Addressed This Visit  Acne rosacea - Primary    Return in about 2 months (around 06/24/2023).   Total time spent: 25 minutes  [name], MD  04/26/2023   This document may have been prepared by [facility] Voice Recognition software and as such may include unintentional dictation errors.

## 2023-06-25 ENCOUNTER — Ambulatory Visit: Payer: 59 | Admitting: Internal Medicine
